# Patient Record
Sex: Female | Born: 1985 | Race: Black or African American | Hispanic: No | Marital: Married | Smoking: Never smoker
Health system: Southern US, Community
[De-identification: ages and names within clinical notes are randomized; demographics above are authoritative.]

## PROBLEM LIST (undated history)

## (undated) DIAGNOSIS — F32A Depression, unspecified: Secondary | ICD-10-CM

## (undated) HISTORY — PX: CYSTECTOMY: SUR359

## (undated) HISTORY — DX: Depression, unspecified: F32.A

---

## 2006-10-28 ENCOUNTER — Ambulatory Visit (HOSPITAL_COMMUNITY): Admission: RE | Admit: 2006-10-28 | Discharge: 2006-10-28 | Payer: Self-pay | Admitting: Obstetrics

## 2006-11-25 ENCOUNTER — Ambulatory Visit (HOSPITAL_COMMUNITY): Admission: RE | Admit: 2006-11-25 | Discharge: 2006-11-25 | Payer: Self-pay | Admitting: Obstetrics

## 2007-02-15 ENCOUNTER — Inpatient Hospital Stay (HOSPITAL_COMMUNITY): Admission: AD | Admit: 2007-02-15 | Discharge: 2007-02-15 | Payer: Self-pay | Admitting: Obstetrics & Gynecology

## 2007-04-04 ENCOUNTER — Inpatient Hospital Stay (HOSPITAL_COMMUNITY): Admission: AD | Admit: 2007-04-04 | Discharge: 2007-04-06 | Payer: Self-pay | Admitting: Obstetrics

## 2007-07-22 IMAGING — US US OB COMP +14 WK
1 series · 14 of 28 positions shown · non-contrast
Comparison: none

OBSTETRICAL ULTRASOUND:

 This ultrasound exam was performed in the [HOSPITAL] Ultrasound Department.  The OB US report was generated in the AS system, and faxed to the ordering physician.  This report is also available in [REDACTED] PACS.

[Series 1: us ob comp +14 wk · 14 of 28 slices shown]
[im 2/28]
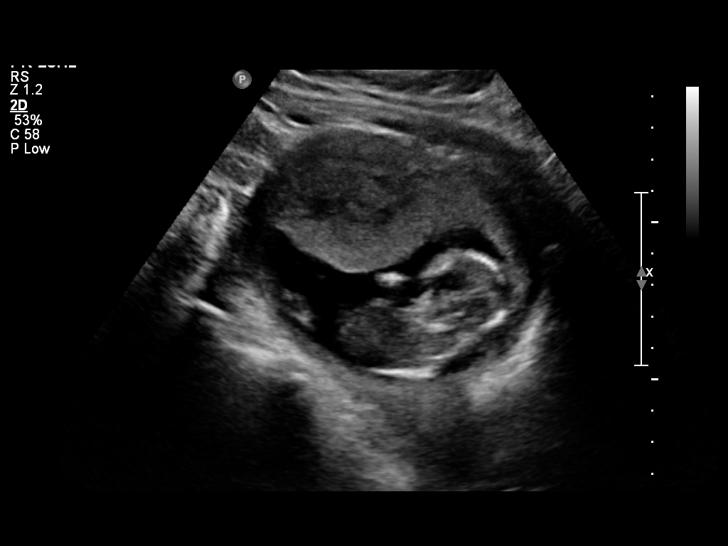
[im 4/28]
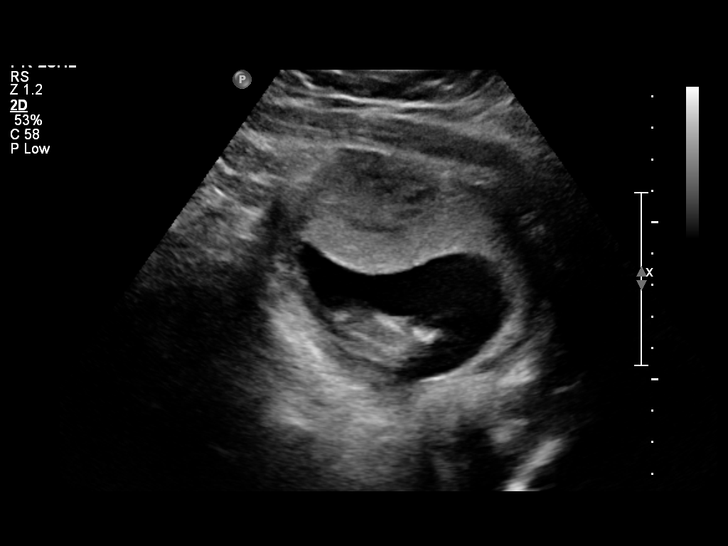
[im 6/28]
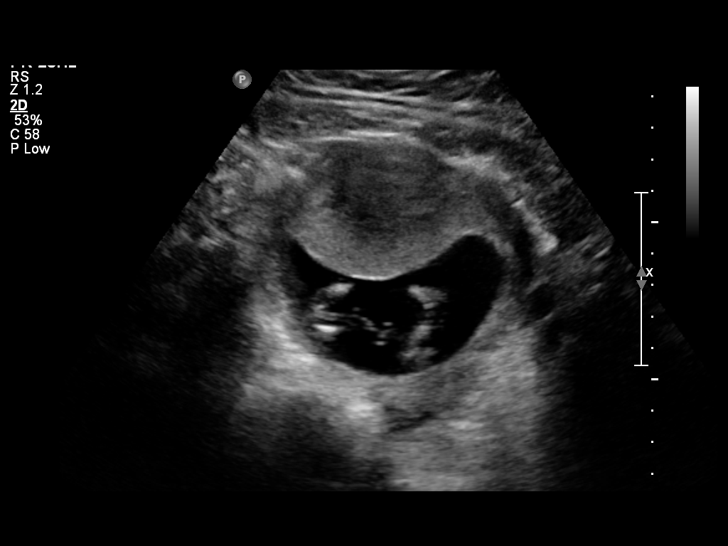
[im 8/28]
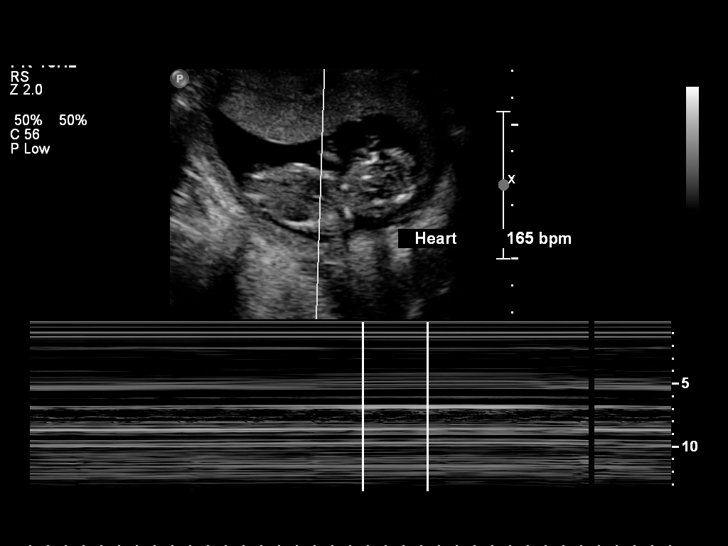
[im 10/28]
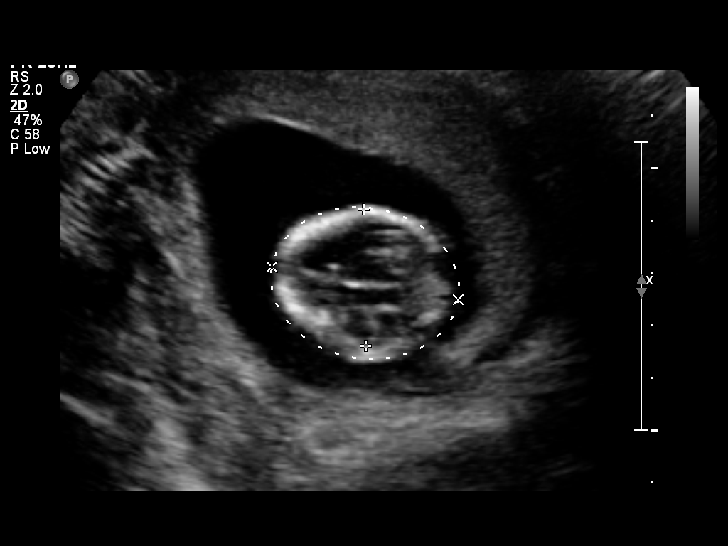
[im 12/28]
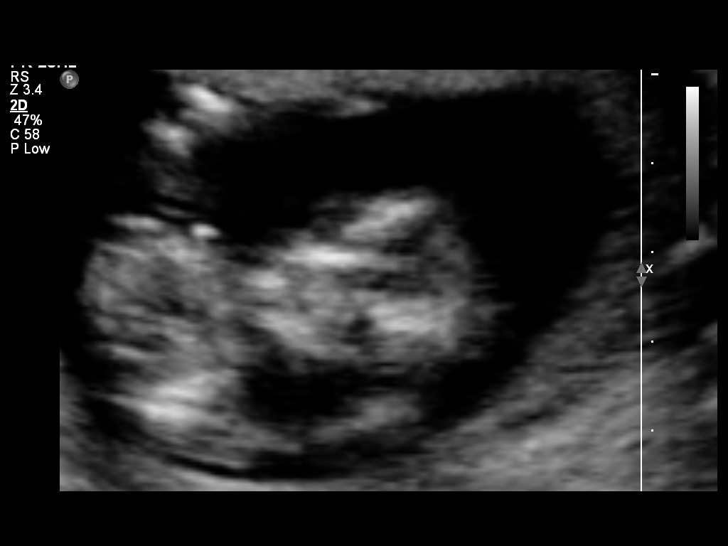
[im 14/28]
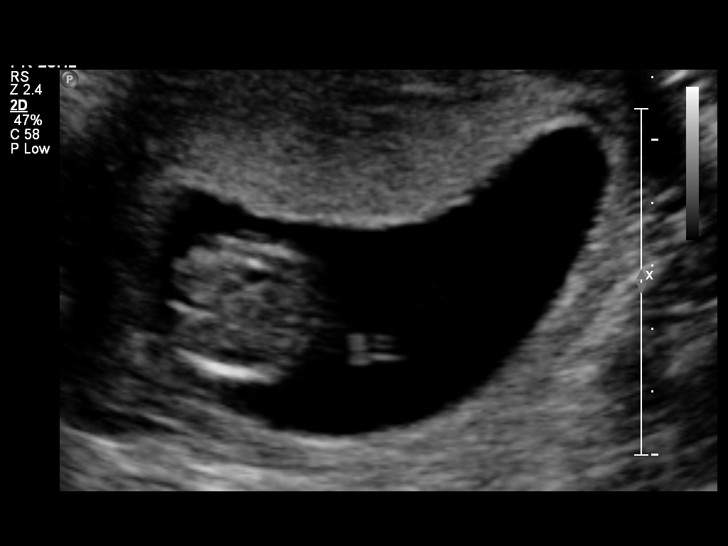
[im 16/28]
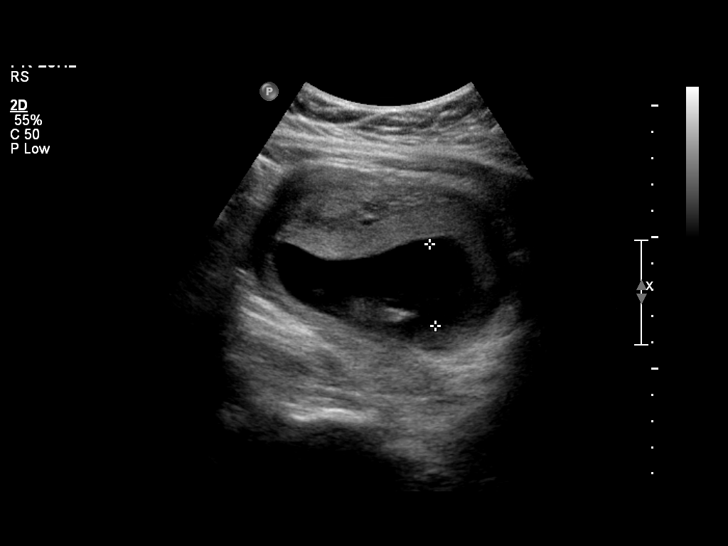
[im 18/28]
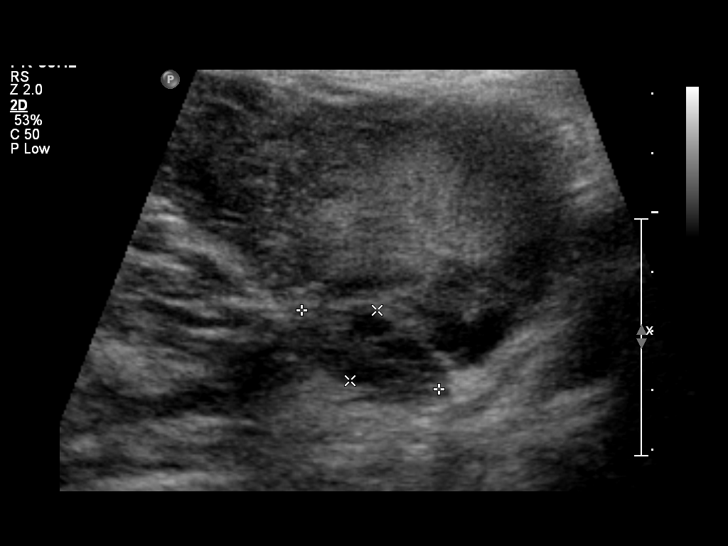
[im 20/28]
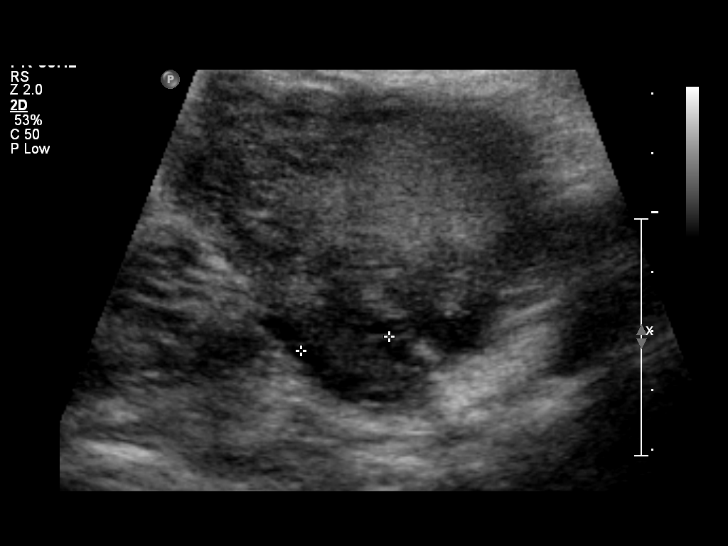
[im 22/28]
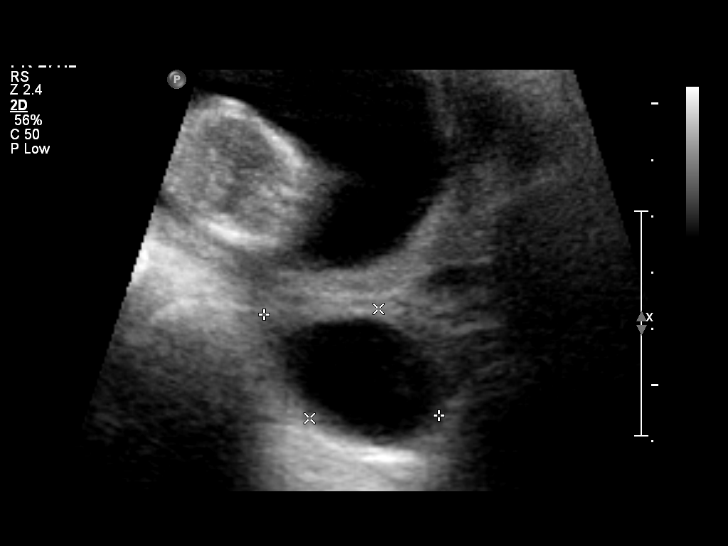
[im 24/28]
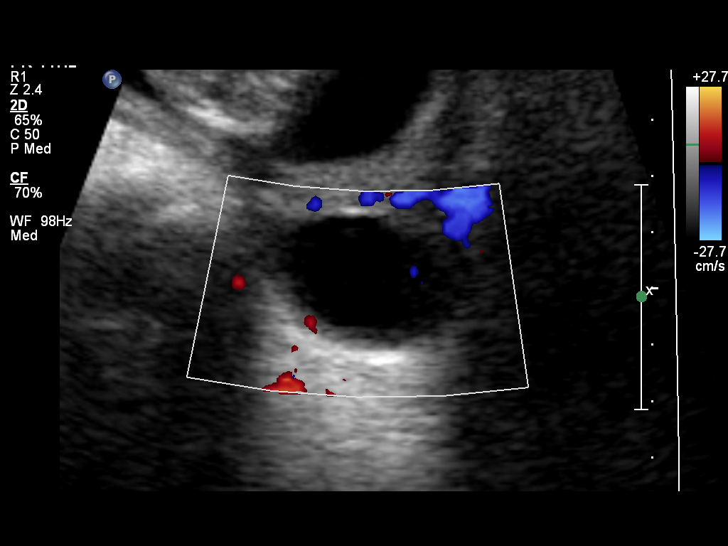
[im 26/28]
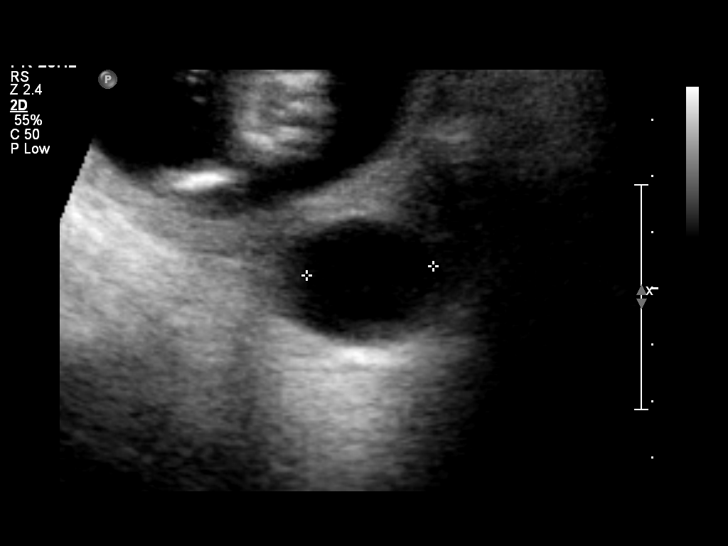
[im 28/28]
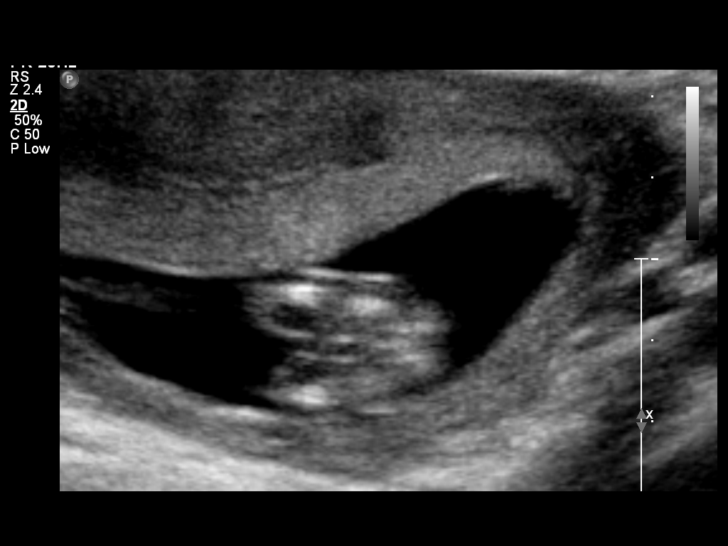

[14 of 28 positions shown; findings below may reference images not displayed]

IMPRESSION: See AS Obstetric US report.

## 2007-08-19 IMAGING — US US OB FOLLOW-UP
1 series · 9 of 9 positions shown · non-contrast
Comparison: none

OBSTETRICAL ULTRASOUND:

 This ultrasound exam was performed in the [HOSPITAL] Ultrasound Department.  The OB US report was generated in the AS system, and faxed to the ordering physician.  This report is also available in [REDACTED] PACS.

[Series 1: us ob follow-up · 0.16mm/px · 9 of 9 slices shown]
[im 1/9]
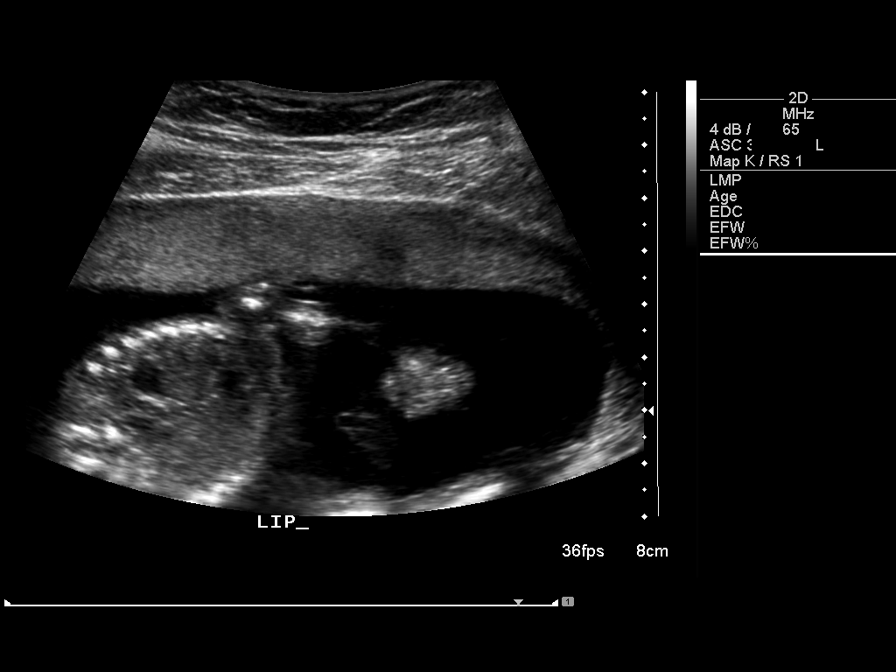
[im 2/9]
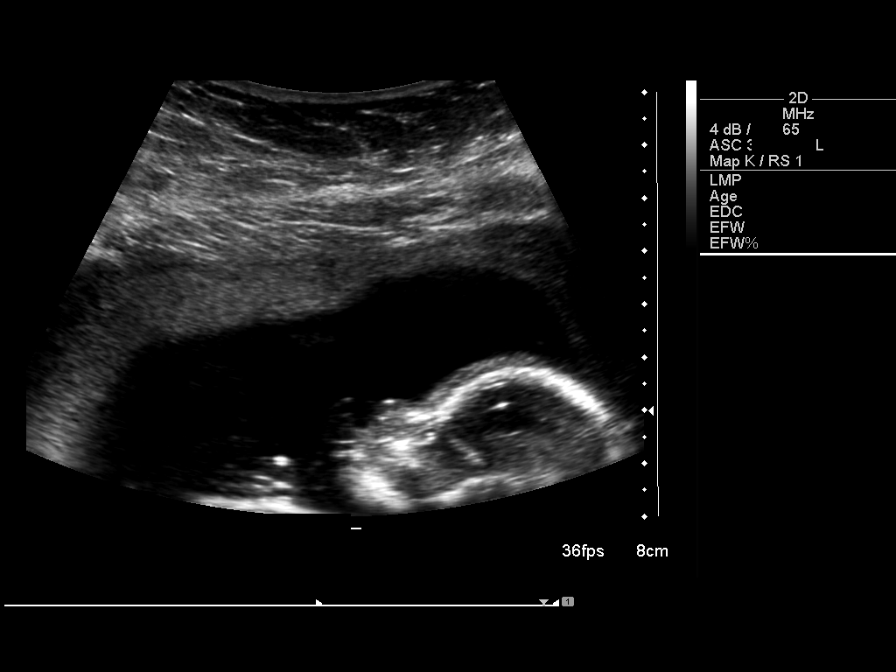
[im 3/9]
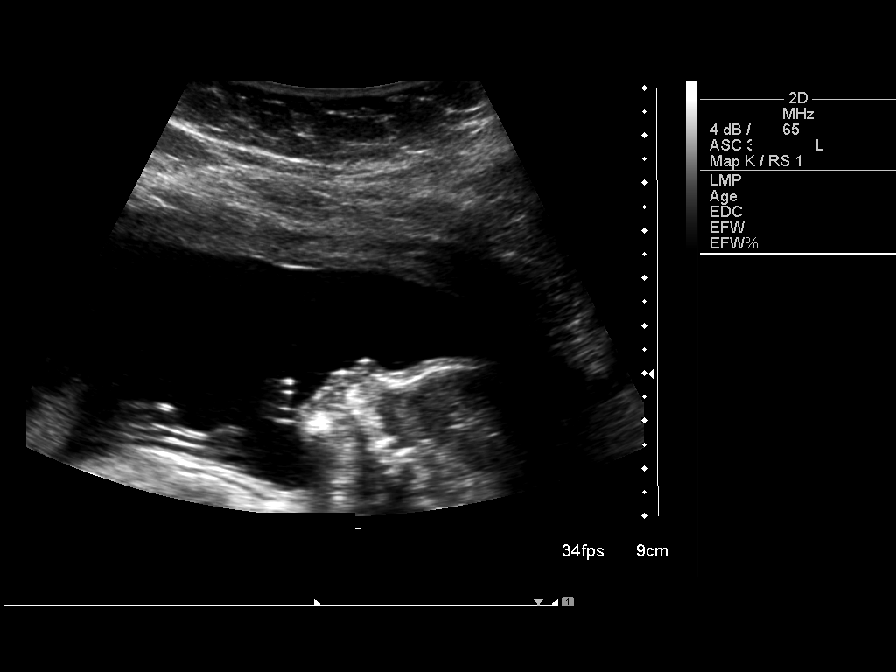
[im 4/9]
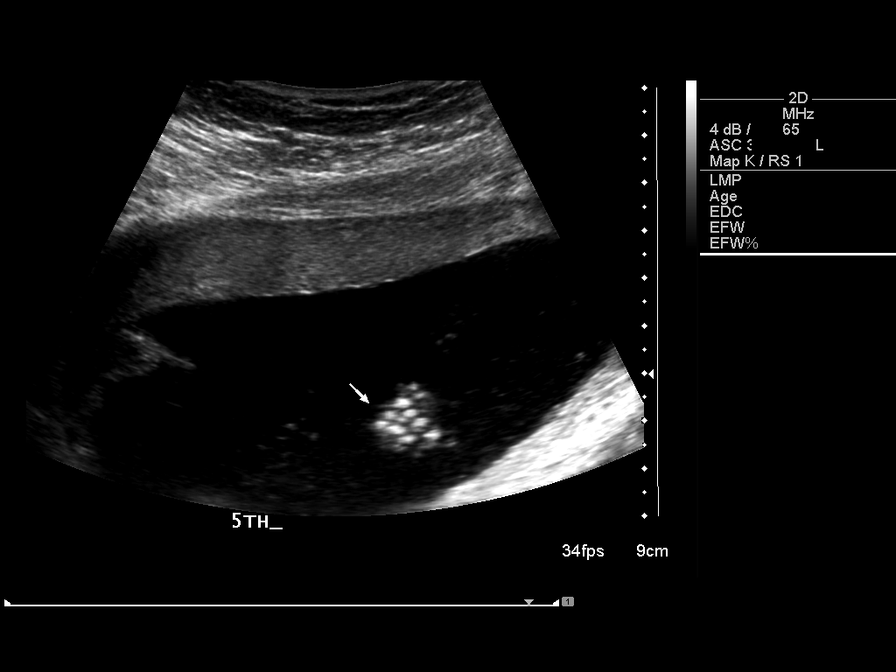
[im 5/9]
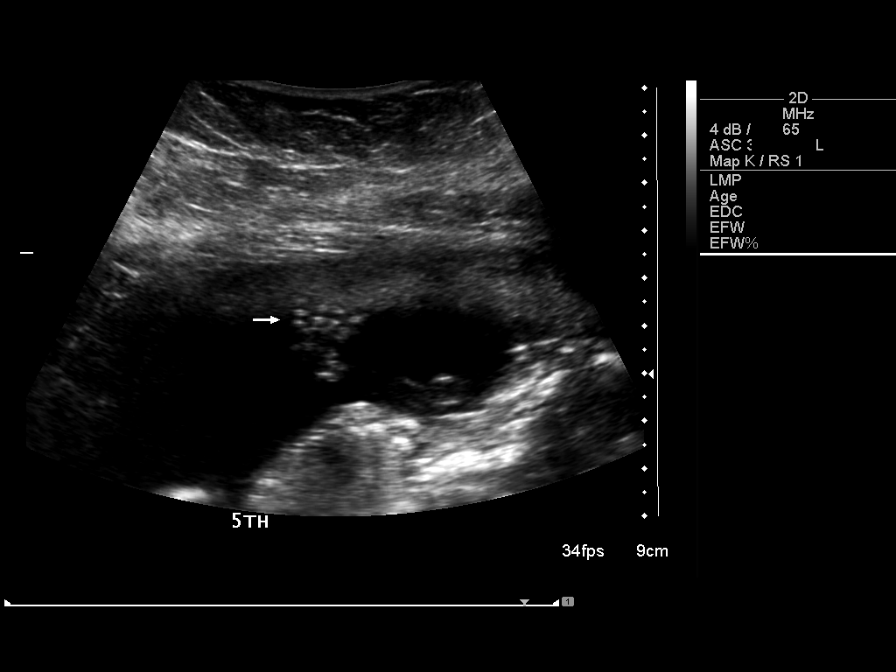
[im 6/9]
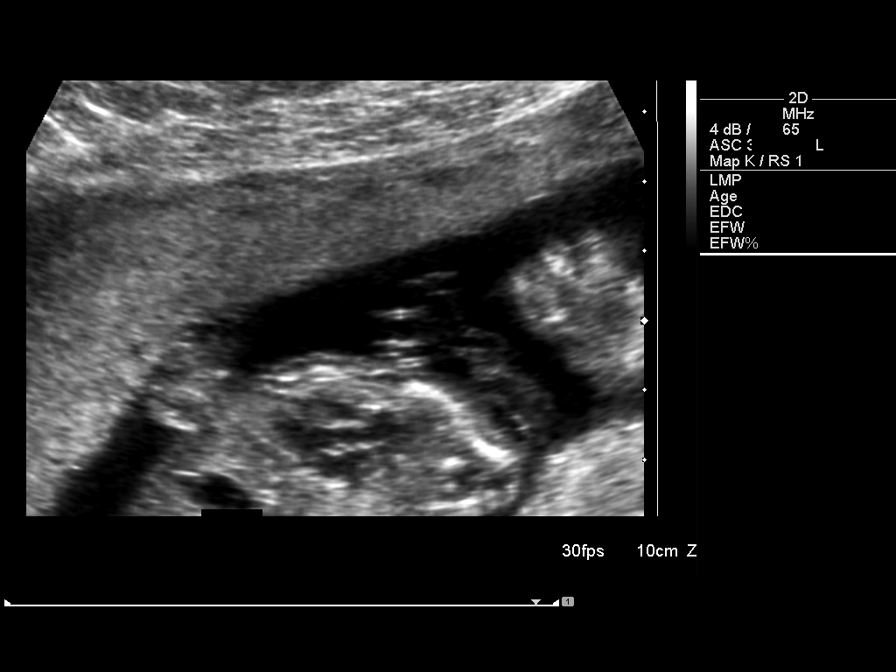
[im 7/9]
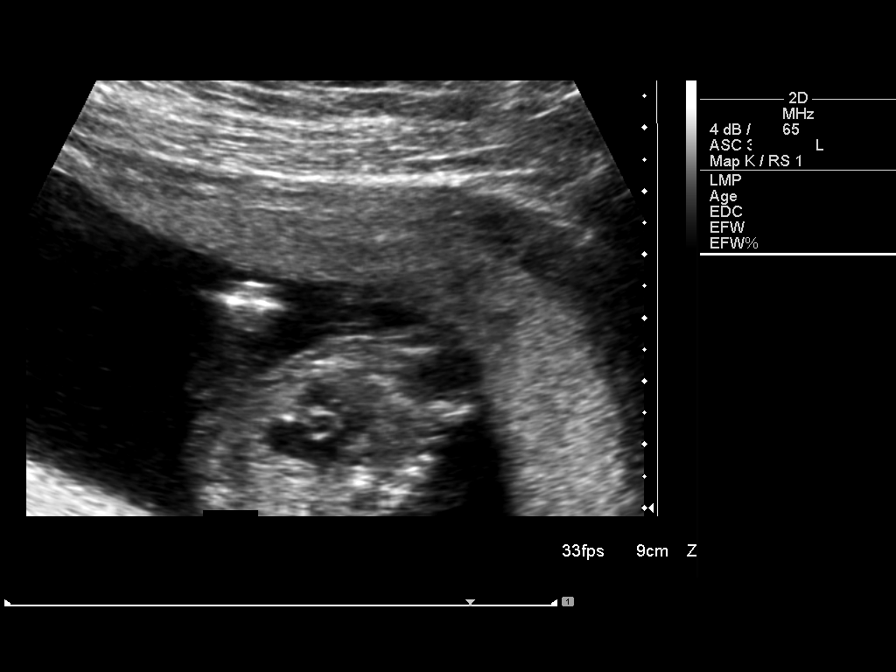
[im 8/9]
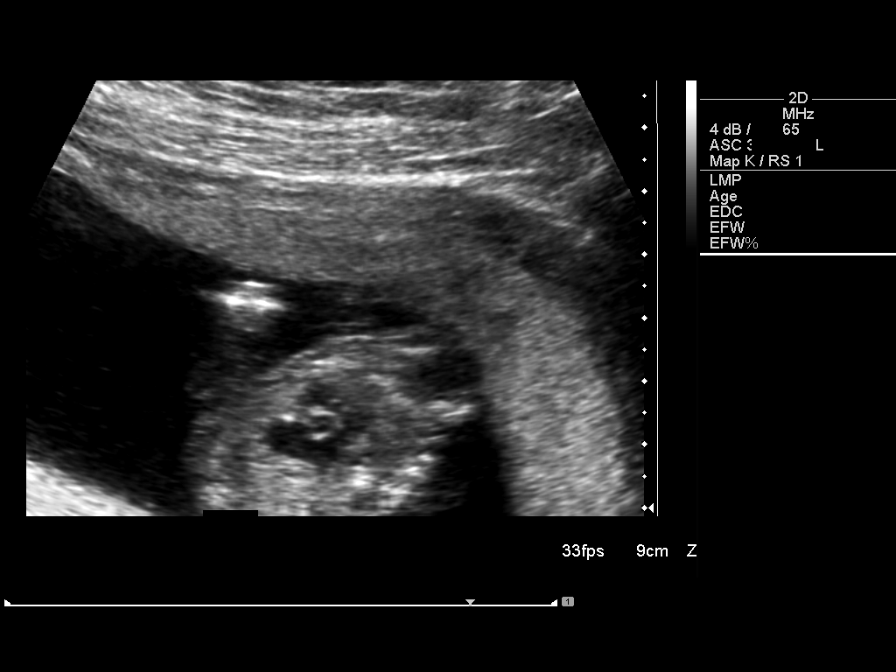
[im 9/9]
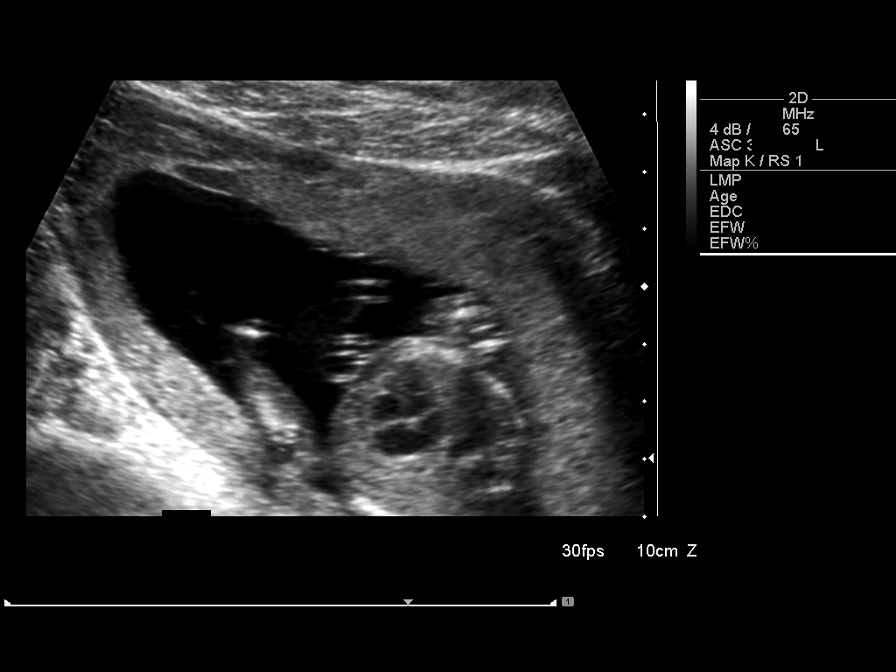

[9 of 9 positions shown; findings below may reference images not displayed]

IMPRESSION: See AS Obstetric US report.

## 2007-09-03 ENCOUNTER — Inpatient Hospital Stay (HOSPITAL_COMMUNITY): Admission: AD | Admit: 2007-09-03 | Discharge: 2007-09-03 | Payer: Self-pay | Admitting: Obstetrics & Gynecology

## 2007-09-03 ENCOUNTER — Encounter: Payer: Self-pay | Admitting: Obstetrics

## 2008-05-27 IMAGING — US US PELVIS COMPLETE MODIFY
1 series · 13 of 25 positions shown · non-contrast
Comparison: Ultrasound abdomen and pelvis [DATE].

CLINICAL DATA: Vaginal delivery 5 months ago. Patient having first period since delivery with very heavy bleeding.
TRANSABDOMINAL AND TRANSVAGINAL PELVIC ULTRASOUND:
TECHNIQUE: Both transabdominal and transvaginal ultrasound examinations of the pelvis were performed including evaluation of the uterus, ovaries, adnexal regions, and pelvic cul-de-sac.

[Series 1: us pelvis complete modify · 0.28mm/px · 13 of 86 slices shown]
[im 1/86]
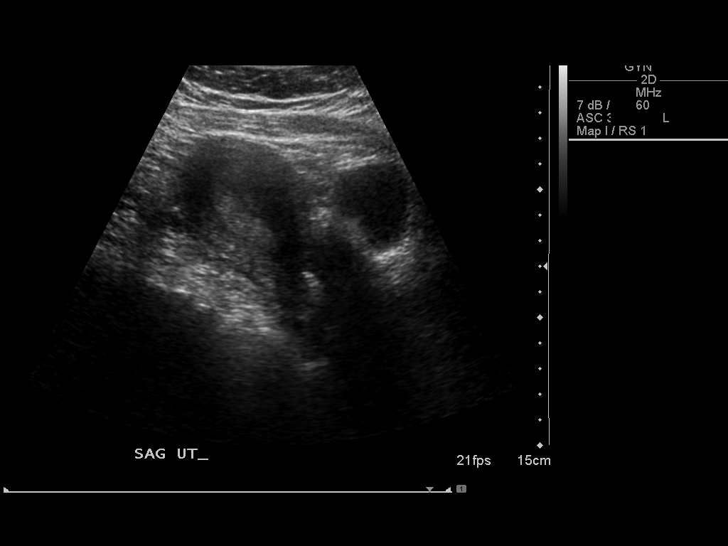
[im 8/86]
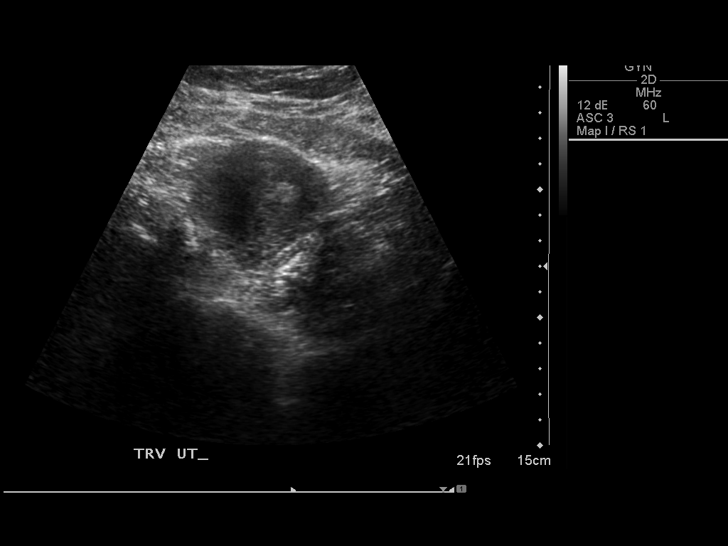
[im 15/86]
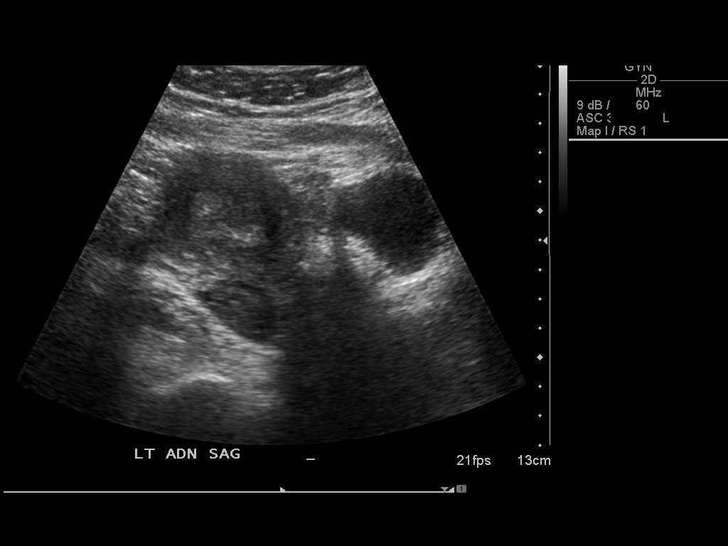
[im 22/86]
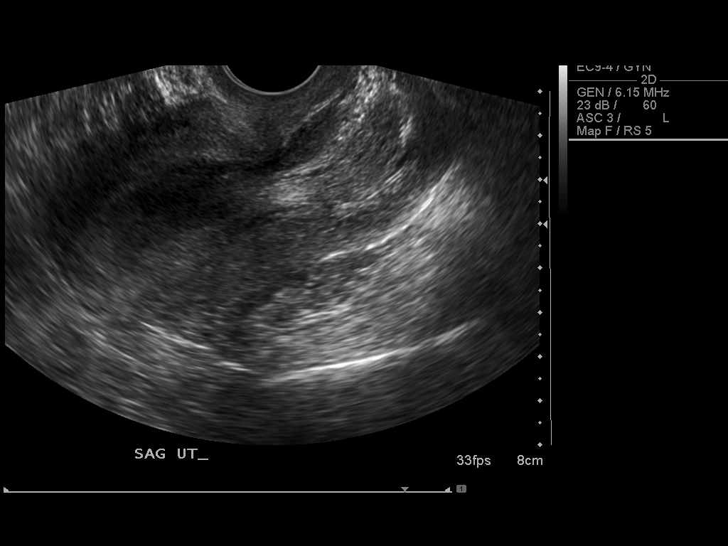
[im 29/86]
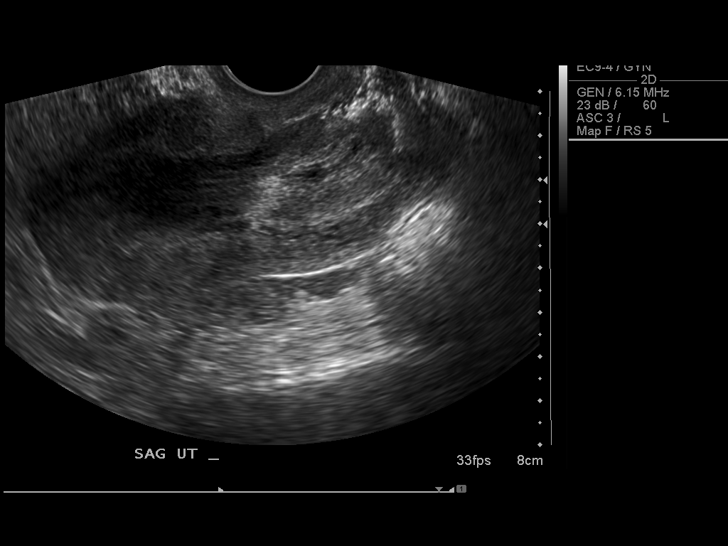
[im 36/86]
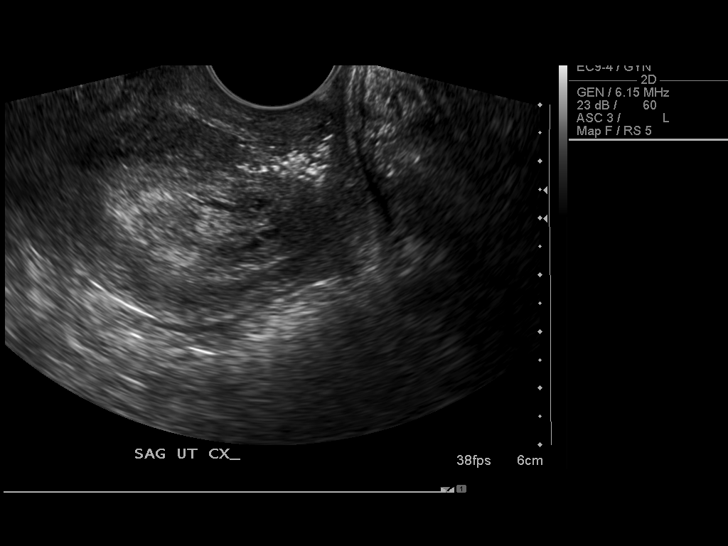
[im 43/86]
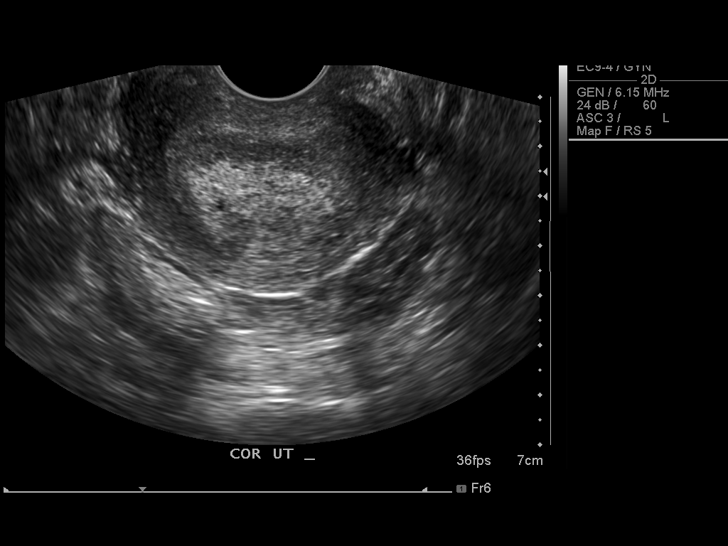
[im 50/86]
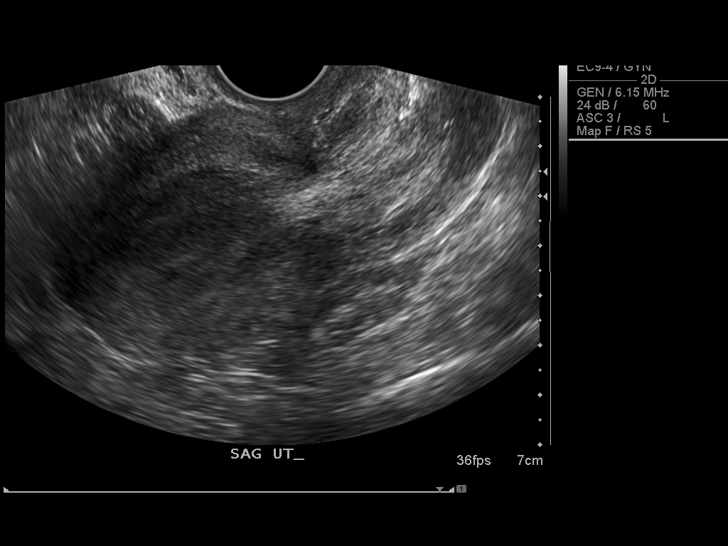
[im 57/86]
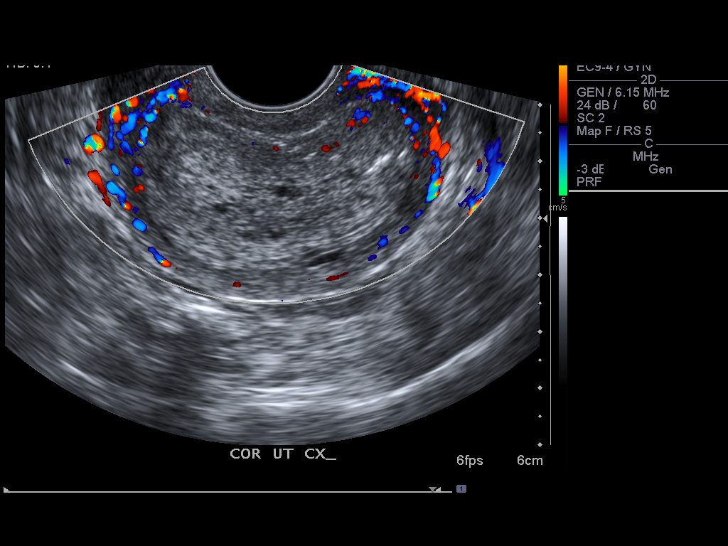
[im 64/86]
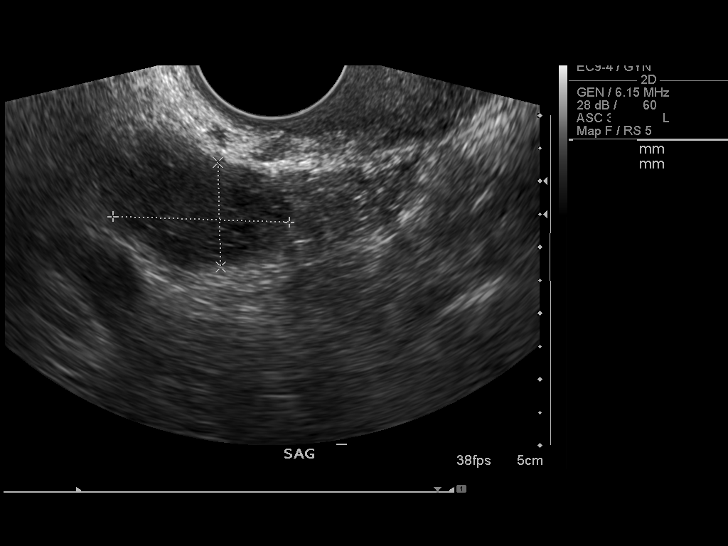
[im 71/86]
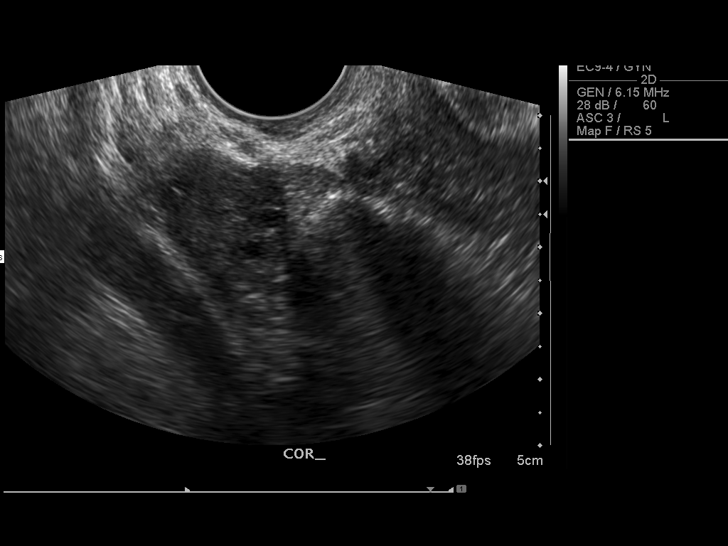
[im 78/86]
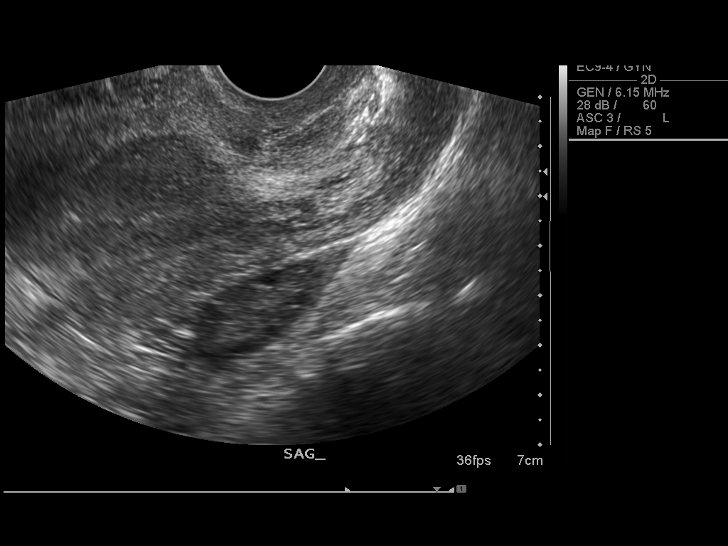
[im 86/86]
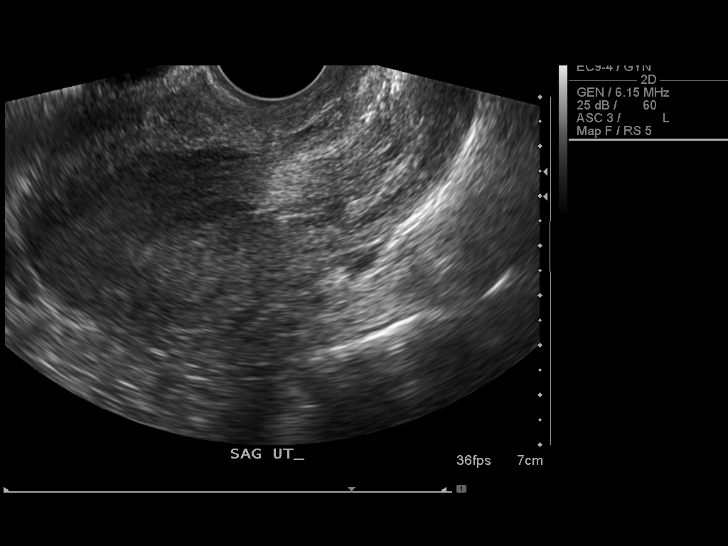

[13 of 25 positions shown; findings below may reference images not displayed]

FINDINGS: The uterus is 9.5 cm in length x 4.6 cm AP diameter x 5.1 cm in width.  
The endometrial canal is thickened and filled with heterogeneous echogenic material with some internal cystic areas, that measures 4.7 cm transverse diameter x 2.4 cm AP diameter x 3.7 cm in sagittal diameter. On some of the images, there is vascular flow seen within this echogenic material.  In the lower uterine segment/region of the cervix, there are multiple very echogenic foci, suggestive of air.  
The ovaries are normal in size and appearance.  There is no free fluid in the pelvis.
IMPRESSION: 1.  Marked thickening of the endometrial canal, filled with echogenic, heterogeneous material . Findings are most concerning for retained products of conception and were discussed with the nurse practitioner taking care of the patient..
2.  Suggestion of air in the lower uterine segment/cervix, likely attributable to recent cervical manipulation in an attempt to remove a blood clot. According to the nurse practitioner, there is no clinical concern for endometritis.
Findings were discussed with Mcewen, the patient?s nurse practitioner.

## 2010-09-14 ENCOUNTER — Encounter: Payer: Self-pay | Admitting: Obstetrics

## 2011-05-14 LAB — GC/CHLAMYDIA PROBE AMP, GENITAL
Chlamydia, DNA Probe: NEGATIVE
GC Probe Amp, Genital: NEGATIVE

## 2011-05-14 LAB — POCT PREGNANCY, URINE: Operator id: 117411

## 2011-05-14 LAB — WET PREP, GENITAL
Clue Cells Wet Prep HPF POC: NONE SEEN
Trich, Wet Prep: NONE SEEN
Yeast Wet Prep HPF POC: NONE SEEN

## 2011-05-14 LAB — CBC
MCV: 83.2
RBC: 4.24
WBC: 8.9

## 2011-06-08 LAB — CBC
HCT: 36
MCHC: 33.3
MCHC: 33.6
MCV: 84.8
Platelets: 245
Platelets: 271
RDW: 14
RDW: 14.3 — ABNORMAL HIGH

## 2011-06-08 LAB — RH IMMUNE GLOB WKUP(>/=20WKS)(NOT WOMEN'S HOSP): Fetal Screen: NEGATIVE

## 2011-06-08 LAB — RPR: RPR Ser Ql: NONREACTIVE

## 2011-06-10 LAB — RH IMMUNE GLOBULIN WORKUP (NOT WOMEN'S HOSP)

## 2016-06-05 ENCOUNTER — Emergency Department
Admission: EM | Admit: 2016-06-05 | Discharge: 2016-06-05 | Disposition: A | Discharge status: Home / Self Care | Payer: Self-pay | Attending: Emergency Medicine | Admitting: Emergency Medicine

## 2016-06-05 ENCOUNTER — Encounter: Payer: Self-pay | Admitting: Emergency Medicine

## 2016-06-05 DIAGNOSIS — H6692 Otitis media, unspecified, left ear: Secondary | ICD-10-CM | POA: Insufficient documentation

## 2016-06-05 MED ORDER — AZITHROMYCIN 250 MG PO TABS: ORAL_TABLET | ORAL | 0 refills | Status: DC

## 2016-06-05 NOTE — ED Provider Notes (Signed)
[facility] Emergency Department Provider Note  ____________________________________________  Time seen: Approximately 10:12 AM  I have reviewed the triage vital signs and the nursing notes.   HISTORY  Chief Complaint Otalgia    HPI Wendy Juarez is a 30 y.o. female who presents for evaluation of bilateral ear pain left greater than right. She feels like something stronger left ear.   History reviewed. No pertinent past medical history.  There are no active problems to display for this patient.   History reviewed. No pertinent surgical history.  Prior to Admission medications   Medication Sig Start Date End Date Taking? Authorizing Provider  azithromycin (ZITHROMAX Z-PAK) 250 MG tablet Take 2 tablets (500 mg) on  Day 1,  followed by 1 tablet (250 mg) once daily on Days 2 through 5. 06/05/16   Evangeline Dakinharles M Rosali Augello, PA-C    Allergies Review of patient's allergies indicates no known allergies.  No family history on file.  Social History Social History  Substance Use Topics  . Smoking status: Never Smoker  . Smokeless tobacco: Never Used  . Alcohol use Not on file    Review of Systems Constitutional: No fever/chills Eyes: No visual changes. ENT: Positive for bilateral ear pain left greater than right. Cardiovascular: Denies chest pain. Respiratory: Denies shortness of breath. Musculoskeletal: Negative for back pain. Skin: Negative for rash. Neurological: Negative for headaches, focal weakness or numbness.  10-point ROS otherwise negative.  ____________________________________________   PHYSICAL EXAM:  VITAL SIGNS: ED Triage Vitals  Enc Vitals Group     BP 06/05/16 0912 (!) 130/55     Pulse Rate 06/05/16 0912 85     Resp 06/05/16 0912 18     Temp 06/05/16 0912 98 F (36.7 C)     Temp Source 06/05/16 0912 Oral     SpO2 06/05/16 0912 99 %     Weight 06/05/16 0912 180 lb (81.6 kg)     Height 06/05/16 0912 5\' 2"  (1.575 m)   Head Circumference --      Peak Flow --      Pain Score 06/05/16 0906 6     Pain Loc --      Pain Edu? --      Excl. in GC? --     Constitutional: Alert and oriented. Well appearing and in no acute distress. Eyes: Conjunctivae are normal. PERRL. EOMI. Head: Atraumatic.Left TM erythematous right TM unremarkable. Nose: No congestion/rhinnorhea. Mouth/Throat: Mucous membranes are moist.  Oropharynx non-erythematous. Neck: No stridor. Supple, full range of motion.   Cardiovascular: Normal rate, regular rhythm. Grossly normal heart sounds.  Good peripheral circulation. Respiratory: Normal respiratory effort.  No retractions. Lungs CTAB. Musculoskeletal: No lower extremity tenderness nor edema.  No joint effusions. Neurologic:  Normal speech and language. No gross focal neurologic deficits are appreciated. No gait instability. Skin:  Skin is warm, dry and intact. No rash noted. Psychiatric: Mood and affect are normal. Speech and behavior are normal.  ____________________________________________   LABS (all labs ordered are listed, but only abnormal results are displayed)  Labs Reviewed - No data to display ____________________________________________  EKG   ____________________________________________  RADIOLOGY   ____________________________________________   PROCEDURES  Procedure(s) performed: None  Critical Care performed: No  ____________________________________________   INITIAL IMPRESSION / ASSESSMENT AND PLAN / ED COURSE  Pertinent labs & imaging results that were available during my care of the patient were reviewed by me and considered in my medical decision making (see chart for details). Review of the  [place] CSRS was performed in accordance of the [facility] prior to dispensing any controlled drugs.  Assessment. Rx Given for Zithromax. Patient Follow-Up PCP or Return to ER As Needed.  Clinical Course    ____________________________________________   FINAL  CLINICAL IMPRESSION(S) / ED DIAGNOSES  Final diagnoses:  Left otitis media, unspecified otitis media type     This chart was dictated using voice recognition software/Dragon. Despite best efforts to proofread, errors can occur which can change the meaning. Any change was purely unintentional.    Evangeline Dakin, PA-C 06/05/16 1043    Evangeline Dakin, PA-C 06/05/16 1135    Phineas Semen, MD 06/05/16 [phone]

## 2016-06-05 NOTE — ED Triage Notes (Signed)
Reports bilat ear pain, states it feels like something is crawling in left ear.

## 2017-12-28 ENCOUNTER — Encounter: Payer: Self-pay | Admitting: Emergency Medicine

## 2017-12-28 ENCOUNTER — Other Ambulatory Visit: Payer: Self-pay

## 2017-12-28 ENCOUNTER — Emergency Department
Admission: EM | Admit: 2017-12-28 | Discharge: 2017-12-28 | Disposition: A | Discharge status: Home / Self Care | Payer: Medicaid Other | Attending: Emergency Medicine | Admitting: Emergency Medicine

## 2017-12-28 ENCOUNTER — Emergency Department: Payer: Medicaid Other

## 2017-12-28 DIAGNOSIS — O9989 Other specified diseases and conditions complicating pregnancy, childbirth and the puerperium: Secondary | ICD-10-CM | POA: Insufficient documentation

## 2017-12-28 DIAGNOSIS — R109 Unspecified abdominal pain: Secondary | ICD-10-CM

## 2017-12-28 DIAGNOSIS — N644 Mastodynia: Secondary | ICD-10-CM | POA: Insufficient documentation

## 2017-12-28 DIAGNOSIS — Z3A11 11 weeks gestation of pregnancy: Secondary | ICD-10-CM | POA: Diagnosis not present

## 2017-12-28 DIAGNOSIS — Z3491 Encounter for supervision of normal pregnancy, unspecified, first trimester: Secondary | ICD-10-CM

## 2017-12-28 DIAGNOSIS — R1030 Lower abdominal pain, unspecified: Secondary | ICD-10-CM | POA: Insufficient documentation

## 2017-12-28 DIAGNOSIS — O26899 Other specified pregnancy related conditions, unspecified trimester: Secondary | ICD-10-CM

## 2017-12-28 DIAGNOSIS — O2311 Infections of bladder in pregnancy, first trimester: Secondary | ICD-10-CM | POA: Insufficient documentation

## 2017-12-28 DIAGNOSIS — N3090 Cystitis, unspecified without hematuria: Secondary | ICD-10-CM

## 2017-12-28 LAB — COMPREHENSIVE METABOLIC PANEL
ALT: 12 U/L — ABNORMAL LOW (ref 14–54)
ANION GAP: 10 (ref 5–15)
AST: 31 U/L (ref 15–41)
Albumin: 3.5 g/dL (ref 3.5–5.0)
Alkaline Phosphatase: 85 U/L (ref 38–126)
BILIRUBIN TOTAL: 1.3 mg/dL — AB (ref 0.3–1.2)
BUN: 6 mg/dL (ref 6–20)
CO2: 18 mmol/L — ABNORMAL LOW (ref 22–32)
Calcium: 9 mg/dL (ref 8.9–10.3)
Chloride: 106 mmol/L (ref 101–111)
Creatinine, Ser: 0.43 mg/dL — ABNORMAL LOW (ref 0.44–1.00)
Glucose, Bld: 108 mg/dL — ABNORMAL HIGH (ref 65–99)
POTASSIUM: 4.2 mmol/L (ref 3.5–5.1)
Sodium: 134 mmol/L — ABNORMAL LOW (ref 135–145)
Total Protein: 7.7 g/dL (ref 6.5–8.1)

## 2017-12-28 LAB — TROPONIN I

## 2017-12-28 LAB — HCG, QUANTITATIVE, PREGNANCY: hCG, Beta Chain, Quant, S: 64249 m[IU]/mL — ABNORMAL HIGH (ref <5)

## 2017-12-28 LAB — URINALYSIS, COMPLETE (UACMP) WITH MICROSCOPIC
BILIRUBIN URINE: NEGATIVE
Glucose, UA: NEGATIVE mg/dL
Ketones, ur: NEGATIVE mg/dL
NITRITE: NEGATIVE
PH: 6 (ref 5.0–8.0)
Protein, ur: NEGATIVE mg/dL
SPECIFIC GRAVITY, URINE: 1.01 (ref 1.005–1.030)

## 2017-12-28 LAB — CBC
HEMATOCRIT: 36.1 % (ref 35.0–47.0)
Hemoglobin: 12.1 g/dL (ref 12.0–16.0)
MCH: 27.5 pg (ref 26.0–34.0)
MCHC: 33.5 g/dL (ref 32.0–36.0)
MCV: 82.2 fL (ref 80.0–100.0)
Platelets: 366 10*3/uL (ref 150–440)
RBC: 4.39 MIL/uL (ref 3.80–5.20)
RDW: 14.9 % — AB (ref 11.5–14.5)
WBC: 9.6 10*3/uL (ref 3.6–11.0)

## 2017-12-28 LAB — POCT PREGNANCY, URINE: PREG TEST UR: POSITIVE — AB

## 2017-12-28 LAB — LIPASE, BLOOD: LIPASE: 28 U/L (ref 11–51)

## 2017-12-28 MED ORDER — CEPHALEXIN 500 MG PO CAPS: 500.0000 mg | ORAL_CAPSULE | Freq: Two times a day (BID) | ORAL | 0 refills | Status: AC

## 2017-12-28 NOTE — ED Provider Notes (Signed)
[facility] Emergency Department Provider Note  ____________________________________________  Time seen: Approximately 12:48 PM  I have reviewed the triage vital signs and the nursing notes.   HISTORY  Chief Complaint Breast Pain and Abdominal Pain    HPI Wendy Juarez is a 32 y.o. female that is 11-[redacted] weeks pregnant presents to the emergency department for evaluation of 2 episodes of right sharp breast pain and an episode of low abdominal pain this morning.  Abdominal pain this morning was sharp and now she feels some mild discomfort. No cramping or feeling of contraction. Breast pain encompassed entire right breast but was primarily above nipple.  Episode lasted about 30 seconds.  She had an ultrasound completed at 8 weeks. Patient is currently breast-feeding and has 5 children.  She has had nausea but none that is out of the ordinary.  No vaginal bleeding or vaginal discharge.  No fever, chills, shortness of breath, dysuria.    History reviewed. No pertinent past medical history.  There are no active problems to display for this patient.   History reviewed. No pertinent surgical history.  Prior to Admission medications   Medication Sig Start Date End Date Taking? Authorizing Provider  azithromycin (ZITHROMAX Z-PAK) 250 MG tablet Take 2 tablets (500 mg) on  Day 1,  followed by 1 tablet (250 mg) once daily on Days 2 through 5. 06/05/16   Beers, Charmayne Sheer, PA-C  cephALEXin (KEFLEX) 500 MG capsule Take 1 capsule (500 mg total) by mouth 2 (two) times daily for 10 days. 12/28/17 01/07/18  Enid Derry, PA-C    Allergies Patient has no known allergies.  No family history on file.  Social History Social History   Tobacco Use  . Smoking status: Never Smoker  . Smokeless tobacco: Never Used  Substance Use Topics  . Alcohol use: Not on file  . Drug use: Not on file     Review of Systems  Constitutional: No fever/chills Respiratory: No  SOB. Gastrointestinal: No vomiting.  Genitourinary: Negative for dysuria. Musculoskeletal: Negative for musculoskeletal pain. Skin: Negative for rash, abrasions, lacerations, ecchymosis.   ____________________________________________   PHYSICAL EXAM:  VITAL SIGNS: ED Triage Vitals  Enc Vitals Group     BP 12/28/17 1212 126/67     Pulse Rate 12/28/17 1212 96     Resp 12/28/17 1212 20     Temp 12/28/17 1212 98.3 F (36.8 C)     Temp Source 12/28/17 1212 Oral     SpO2 12/28/17 1212 99 %     Weight 12/28/17 1214 180 lb (81.6 kg)     Height 12/28/17 1214  (1.575 m)     Head Circumference --      Peak Flow --      Pain Score 12/28/17 1231 4     Pain Loc --      Pain Edu? --      Excl. in GC? --      Constitutional: Alert and oriented. Well appearing and in no acute distress. Eyes: Conjunctivae are normal. PERRL. EOMI. Head: Atraumatic. ENT:      Ears:      Nose: No congestion/rhinnorhea.      Mouth/Throat: Mucous membranes are moist.  Neck: No stridor.   Cardiovascular: Normal rate, regular rhythm.  Good peripheral circulation. Respiratory: Normal respiratory effort without tachypnea or retractions. Lungs CTAB. Good air entry to the bases with no decreased or absent breath sounds. Breast: Mild tenderness to palpation above nipple. No drainage or erythema.  No masses. Gastrointestinal: Bowel sounds 4 quadrants. Soft and nontender to palpation. No guarding or rigidity. No palpable masses. No distention. No CVA tenderness. Musculoskeletal: Full range of motion to all extremities. No gross deformities appreciated. Neurologic:  Normal speech and language. No gross focal neurologic deficits are appreciated.  Skin:  Skin is warm, dry and intact. No rash noted. Psychiatric: Mood and affect are normal. Speech and behavior are normal. Patient exhibits appropriate insight and judgement.   ____________________________________________   LABS (all labs ordered are listed, but  only abnormal results are displayed)  Labs Reviewed  CBC - Abnormal; Notable for the following components:      Result Value   RDW 14.9 (*)    All other components within normal limits  COMPREHENSIVE METABOLIC PANEL - Abnormal; Notable for the following components:   Sodium 134 (*)    CO2 18 (*)    Glucose, Bld 108 (*)    Creatinine, Ser 0.43 (*)    ALT 12 (*)    Total Bilirubin 1.3 (*)    All other components within normal limits  URINALYSIS, COMPLETE (UACMP) WITH MICROSCOPIC - Abnormal; Notable for the following components:   Color, Urine YELLOW (*)    APPearance CLEAR (*)    Hgb urine dipstick SMALL (*)    Leukocytes, UA TRACE (*)    Bacteria, UA RARE (*)    All other components within normal limits  HCG, QUANTITATIVE, PREGNANCY - Abnormal; Notable for the following components:   hCG, Beta Chain, Quant, S 64,249 (*)    All other components within normal limits  POCT PREGNANCY, URINE - Abnormal; Notable for the following components:   Preg Test, Ur POSITIVE (*)    All other components within normal limits  URINE CULTURE  TROPONIN I  LIPASE, BLOOD  POC URINE PREG, ED   ____________________________________________  EKG  NSR ____________________________________________  RADIOLOGY   US Ob Comp Less 14 Wks  Result Date: 12/28/2017 CLINICAL DATA:  32 year old female with acute abdominal pain today. Estimated gestational age by LMP 11 weeks and 0 days. Quantitative beta HCG pending. EXAM: OBSTETRIC <14 WK ULTRASOUND TECHNIQUE: Transabdominal ultrasound was performed for evaluation of the gestation as well as the maternal uterus and adnexal regions. COMPARISON:  None relevant. FINDINGS: Intrauterine gestational sac: Single Yolk sac:  Not visible Embryo:  Visible Cardiac Activity: Detected Heart Rate: 165 bpm CRL:   33.2 mm   10 w 1 d                  [place] EDC: 07/25/2018 Subchorionic hemorrhage:  None visualized. Maternal uterus/adnexae: The right ovary measures 2.9 x 4.3 x 2.5  centimeters and contains an oval 2.9 centimeter hypoechoic cyst which might be a corpus luteum (image 66). The left ovary is estimated at 3.3 x 1.4 by 2.8 centimeters and appears normal (image 83). IMPRESSION: Single living IUP demonstrated. No acute maternal findings visualized. Electronically Signed   By: Odessa Fleming M.D.   On: 12/28/2017 14:33    ____________________________________________    PROCEDURES  Procedure(s) performed:    Procedures    Medications - No data to display   ____________________________________________   INITIAL IMPRESSION / ASSESSMENT AND PLAN / ED COURSE  Pertinent labs & imaging results that were available during my care of the patient were reviewed by me and considered in my medical decision making (see chart for details).  Review of the [place] CSRS was performed in accordance of the [facility] prior to dispensing any controlled drugs.  Patient's diagnosis is consistent with cystitis.  Vital signs and exam are reassuring.  Ultrasound shows single intrauterine pregnancy at 10 weeks.  Patient has not had any episodes of breast pain or abdominal pain while in ED.  Urinalysis consistent with infection.  It will be sent for culture.  CBC, CMP, troponin, lipase are reassuring.  EKG shows NSR. Patient will be discharged home with prescriptions for Keflex. Patient is to follow up with obstetrics as directed. Patient is given ED precautions to return to the ED for any worsening or new symptoms.     ____________________________________________  FINAL CLINICAL IMPRESSION(S) / ED DIAGNOSES  Final diagnoses:  First trimester pregnancy  Cystitis      NEW MEDICATIONS STARTED DURING THIS VISIT:  ED Discharge Orders        Ordered    cephALEXin (KEFLEX) 500 MG capsule  2 times daily     12/28/17 1533          This chart was dictated using voice recognition software/Dragon. Despite best efforts to proofread, errors can occur which can change the meaning. Any  change was purely unintentional.    Enid Derry, PA-C 12/28/17 1642    Sharman Cheek, MD 12/29/17 2027

## 2017-12-28 NOTE — ED Provider Notes (Signed)
ED ECG REPORT I, Dionne Bucy, the attending physician, personally viewed and interpreted this ECG.  Date: 12/28/2017 EKG Time: 1335 Rate: 77 Rhythm: normal sinus rhythm QRS Axis: normal Intervals: normal ST/T Wave abnormalities: Anterior lateral T wave flattening, nonspecific Narrative Interpretation: Nonspecific T wave abnormalities, with no evidence of acute ischemia.  No prior EKG available for comparison.    Dionne Bucy, MD 12/28/17 1341

## 2017-12-28 NOTE — ED Triage Notes (Signed)
Presents with 2 episodes of sharp right breast pain which lasted only a few secs    Then developed generalized stomach discomfort   With some nausea  Pt is 11-12 weeks preg    Denies any vaginal bleeding or urinary sx's

## 2017-12-30 LAB — URINE CULTURE

## 2019-02-01 IMAGING — US US OB COMP LESS 14 WK
1 series · 14 of 28 positions shown · non-contrast
Comparison: None relevant.

CLINICAL DATA: 32-year-old female with acute abdominal pain today.
Estimated gestational age by LMP 11 weeks and 0 days. Quantitative
beta HCG pending.

EXAM:
OBSTETRIC <14 WK ULTRASOUND
TECHNIQUE: Transabdominal ultrasound was performed for evaluation of the
gestation as well as the maternal uterus and adnexal regions.

[Series 1: us ob comp less 14 wk · 0.25mm/px · 14 of 82 slices shown]
[im 4/82]
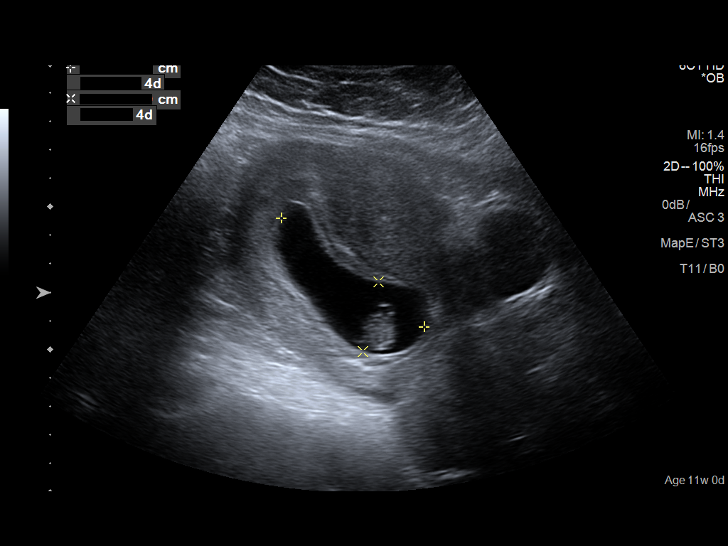
[im 10/82]
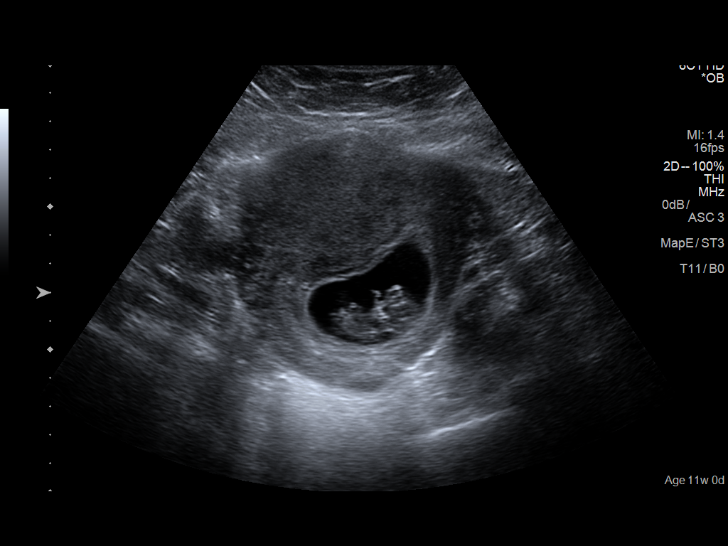
[im 16/82]
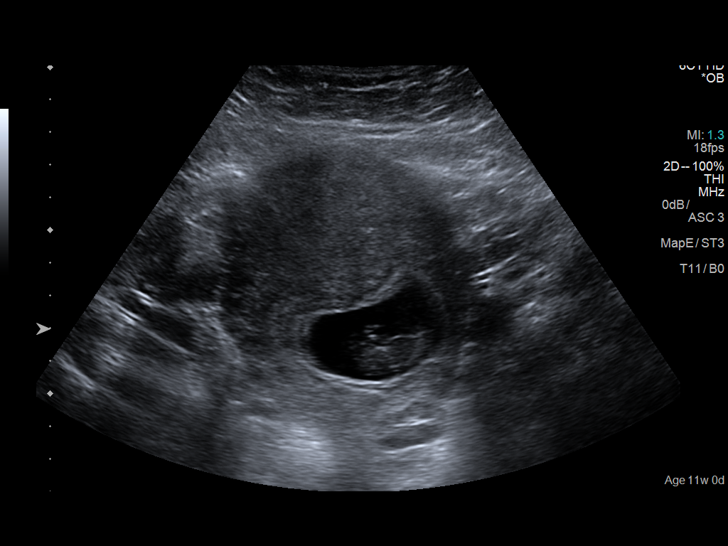
[im 22/82]
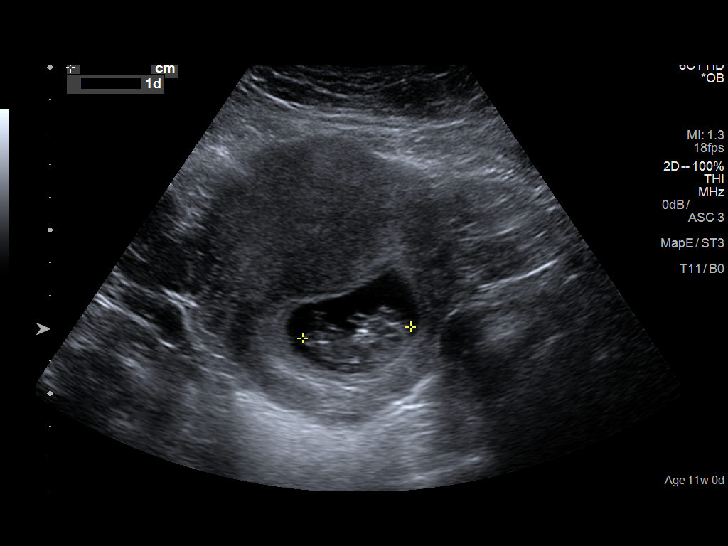
[im 28/82]
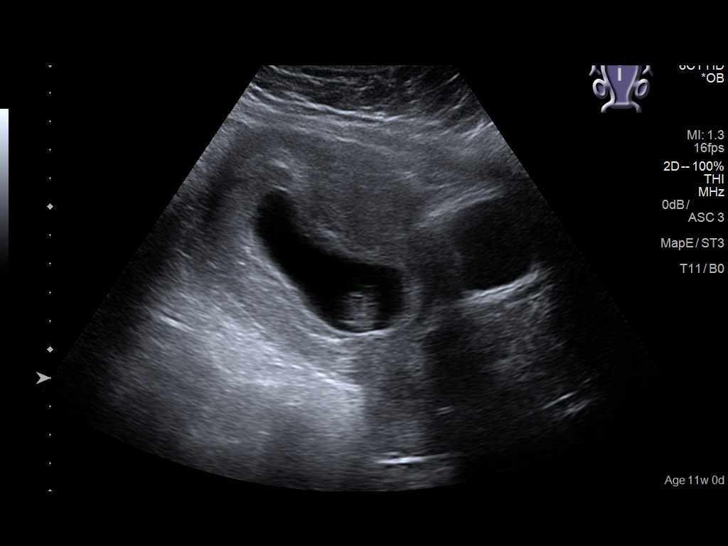
[im 34/82]
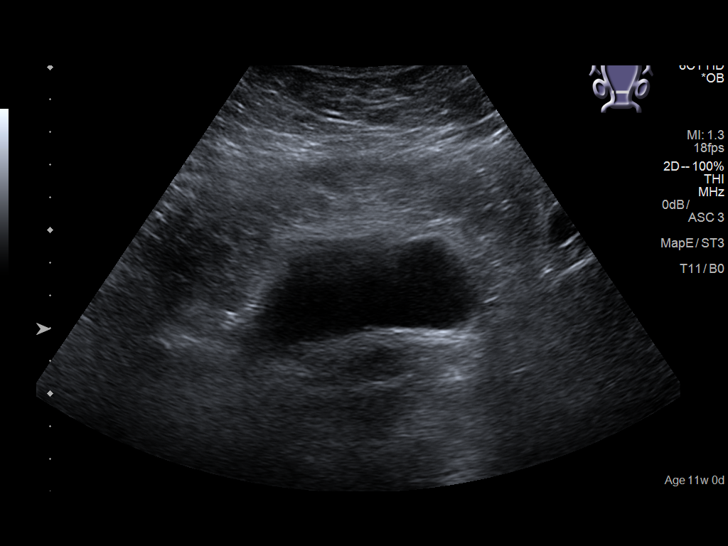
[im 40/82]
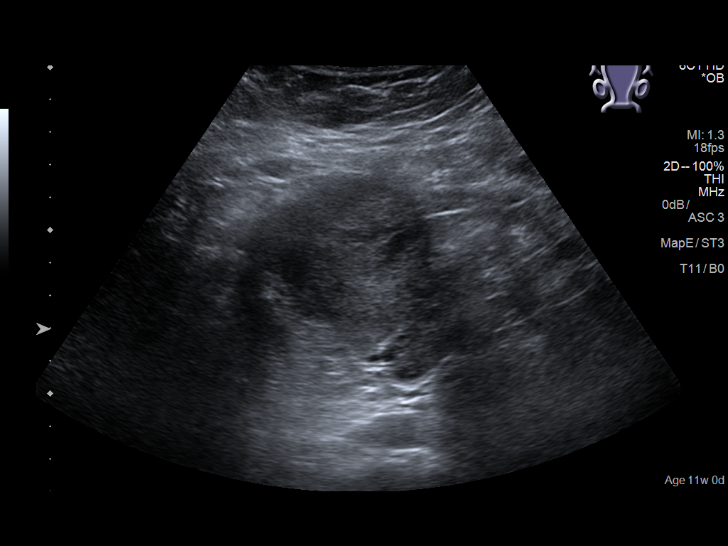
[im 46/82]
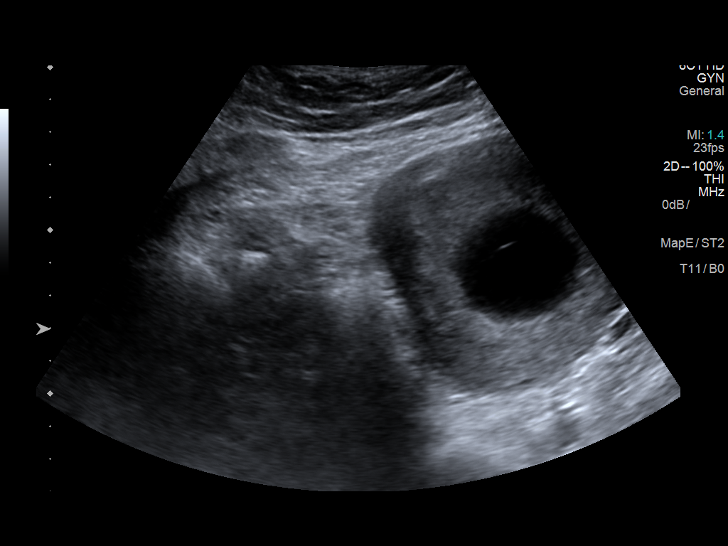
[im 52/82]
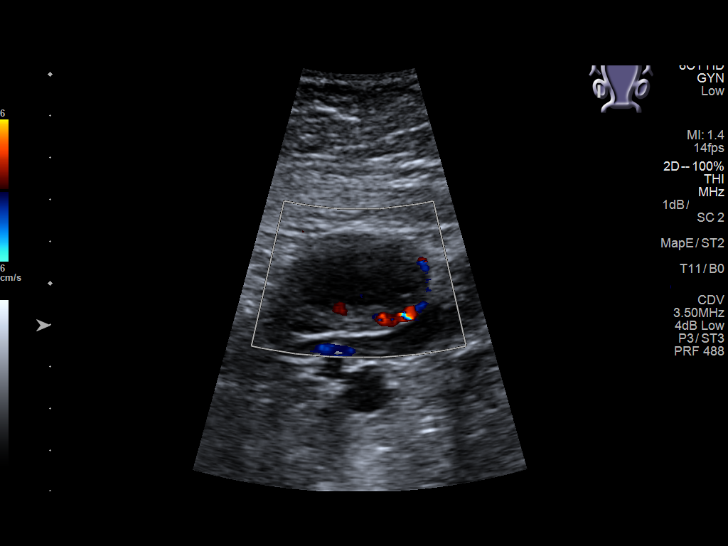
[im 58/82]
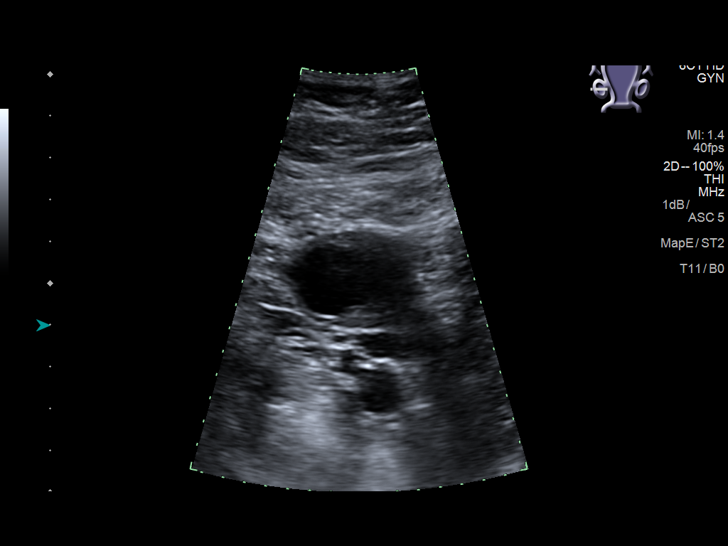
[im 64/82]
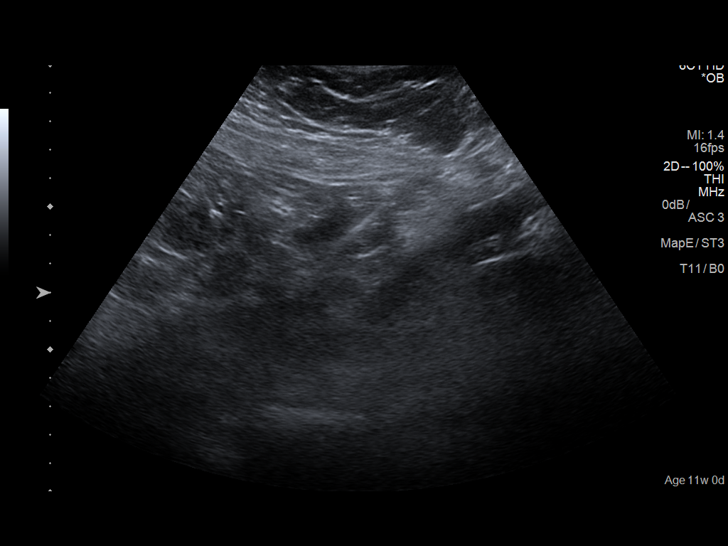
[im 70/82]
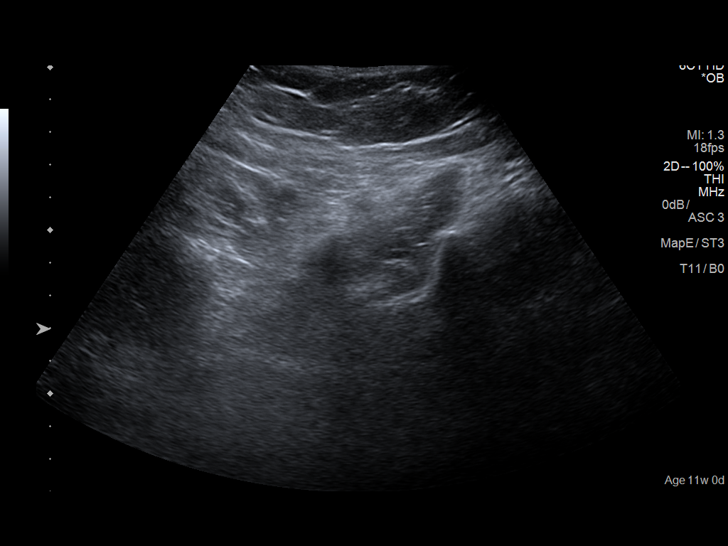
[im 76/82]
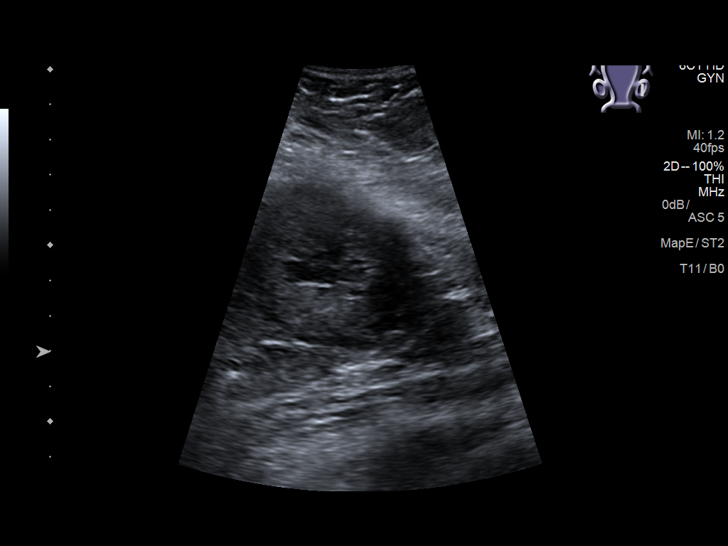
[im 82/82]
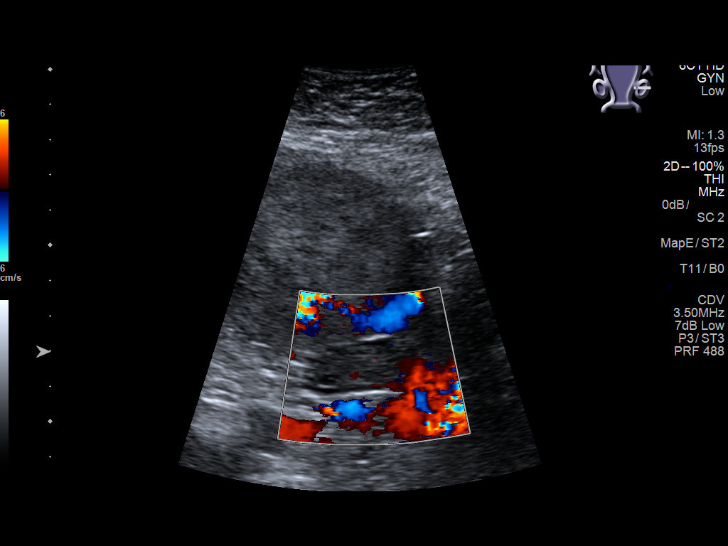

[14 of 28 positions shown; findings below may reference images not displayed]

FINDINGS: Intrauterine gestational sac: Single

Yolk sac:  Not visible

Embryo:  Visible

Cardiac Activity: Detected

Heart Rate: 165 bpm

CRL:   33.2 mm   10 w 1 d                  US EDC: 07/25/2018

Subchorionic hemorrhage:  None visualized.

Maternal uterus/adnexae:

The right ovary measures 2.9 x 4.3 x 2.5 centimeters and contains an
oval 2.9 centimeter hypoechoic cyst which might be a corpus luteum
(image 66).

The left ovary is estimated at 3.3 x 1.4 by 2.8 centimeters and
appears normal (image 83).
IMPRESSION: Single living IUP demonstrated. No acute maternal findings
visualized.

## 2019-07-11 DIAGNOSIS — E669 Obesity, unspecified: Secondary | ICD-10-CM | POA: Insufficient documentation

## 2019-07-13 DIAGNOSIS — B009 Herpesviral infection, unspecified: Secondary | ICD-10-CM | POA: Insufficient documentation

## 2019-07-13 DIAGNOSIS — Z8619 Personal history of other infectious and parasitic diseases: Secondary | ICD-10-CM | POA: Insufficient documentation

## 2020-03-17 DIAGNOSIS — O364XX0 Maternal care for intrauterine death, not applicable or unspecified: Secondary | ICD-10-CM | POA: Insufficient documentation

## 2021-01-02 DIAGNOSIS — Z6791 Unspecified blood type, Rh negative: Secondary | ICD-10-CM | POA: Insufficient documentation

## 2021-08-24 NOTE — L&D Delivery Note (Signed)
[facility] Delivery Note  Wendy Juarez is a 36 y.o. [id number] s/p SOL at [redacted]w[redacted]d. She was admitted for active labor.   ROM: 2h 71m with clear fluid GBS Status: unk (declined) Maximum Maternal Temperature: 98.1  Labor Progress: Ms Fiallo was admitted at 7cm. She had an epidural placed and then progressed slowly to 9-10cm at which time she had SROM with exam.  Delivery Date/Time: August 24th, 2023 at 0555 Delivery: Called to room and patient was complete and pushing in  H&K position. Head delivered OA. No nuchal cord present. Shoulder and body delivered in usual fashion with the assistance of the FOB. Infant with spontaneous cry, placed on mother's abdomen, dried and stimulated. Cord clamped x 2 after it had stopped pulsing and had blanched, and was cut by FOB. Cord blood drawn. Placenta delivered spontaneously with gentle cord traction. Fundus firm with massage and Pitocin. Labia, perineum, vagina, and cervix inspected and found to be intact.   Placenta: spont, intact; home with patient Complications: none Lacerations: none EBL: 181cc Analgesia: epidural  Postpartum Planning [x]  message to sent to schedule follow-up   Infant: boy  APGARs 9/10  2660g (5lb 13.8oz)  07-08-1988, CNM  04/16/2022 6:08 AM

## 2021-10-03 LAB — OB RESULTS CONSOLE RUBELLA ANTIBODY, IGM: Rubella: IMMUNE

## 2021-10-03 LAB — OB RESULTS CONSOLE RPR: RPR: NONREACTIVE

## 2021-10-03 LAB — OB RESULTS CONSOLE HEPATITIS B SURFACE ANTIGEN: Hepatitis B Surface Ag: NEGATIVE

## 2021-10-03 LAB — OB RESULTS CONSOLE HIV ANTIBODY (ROUTINE TESTING): HIV: NONREACTIVE

## 2021-10-09 ENCOUNTER — Encounter: Payer: Self-pay | Admitting: Obstetrics and Gynecology

## 2021-10-17 ENCOUNTER — Encounter: Payer: Medicaid Other | Admitting: Obstetrics and Gynecology

## 2021-10-20 NOTE — Progress Notes (Signed)
Patient did not keep her visit today.  Duane Lope, NP 10/20/2021 10:53 AM

## 2021-10-31 ENCOUNTER — Other Ambulatory Visit: Payer: Self-pay

## 2021-10-31 ENCOUNTER — Encounter: Payer: Self-pay | Admitting: Certified Nurse Midwife

## 2021-10-31 ENCOUNTER — Ambulatory Visit (INDEPENDENT_AMBULATORY_CARE_PROVIDER_SITE_OTHER): Payer: 59 | Admitting: Certified Nurse Midwife

## 2021-10-31 VITALS — BP 117/78 | HR 84 | Wt 198.0 lb

## 2021-10-31 DIAGNOSIS — O99210 Obesity complicating pregnancy, unspecified trimester: Secondary | ICD-10-CM | POA: Insufficient documentation

## 2021-10-31 DIAGNOSIS — B009 Herpesviral infection, unspecified: Secondary | ICD-10-CM

## 2021-10-31 DIAGNOSIS — O98312 Other infections with a predominantly sexual mode of transmission complicating pregnancy, second trimester: Secondary | ICD-10-CM | POA: Diagnosis not present

## 2021-10-31 DIAGNOSIS — O99212 Obesity complicating pregnancy, second trimester: Secondary | ICD-10-CM | POA: Diagnosis not present

## 2021-10-31 DIAGNOSIS — O09529 Supervision of elderly multigravida, unspecified trimester: Secondary | ICD-10-CM | POA: Insufficient documentation

## 2021-10-31 DIAGNOSIS — Z3A14 14 weeks gestation of pregnancy: Secondary | ICD-10-CM | POA: Diagnosis not present

## 2021-10-31 DIAGNOSIS — O0990 Supervision of high risk pregnancy, unspecified, unspecified trimester: Secondary | ICD-10-CM

## 2021-10-31 DIAGNOSIS — O0942 Supervision of pregnancy with grand multiparity, second trimester: Secondary | ICD-10-CM | POA: Diagnosis not present

## 2021-10-31 DIAGNOSIS — O09522 Supervision of elderly multigravida, second trimester: Secondary | ICD-10-CM | POA: Diagnosis not present

## 2021-10-31 DIAGNOSIS — O0992 Supervision of high risk pregnancy, unspecified, second trimester: Secondary | ICD-10-CM | POA: Diagnosis not present

## 2021-10-31 MED ORDER — ASPIRIN 81 MG PO CHEW: 81.0000 mg | CHEWABLE_TABLET | Freq: Every day | ORAL | 3 refills | Status: DC

## 2021-10-31 NOTE — Progress Notes (Signed)
Prenatal labs and [id number] scanned under media ?

## 2021-10-31 NOTE — Progress Notes (Signed)
?  ? ?Subjective:  ? ?Wendy Juarez is a 36 y.o. [id number] at [redacted]w[redacted]d by early ultrasound being seen today for her first obstetrical visit.  Her obstetrical history is significant for advanced maternal age, obesity, and previous IUFD, grand multiparity . Patient does intend to breast feed. Pregnancy history fully reviewed. ? ?Patient reports no complaints. ? ?HISTORY: ?OB History  ?Gravida Para Term Preterm AB Living  ?9 8 7 1  0 7  ?SAB IAB Ectopic Multiple Live Births  ?0 0 0 0 0  ?  ?# Outcome Date GA Lbr Len/2nd Weight Sex Delivery Anes PTL Lv  ?9 Current           ?8 Term 2022 [redacted]w[redacted]d   M Vag-Spont     ?7 Term 2021 [redacted]w[redacted]d   [name]   FD  ?6 Term 2019 [redacted]w[redacted]d   M Vag-Spont   FD  ?5 Term 2017 [redacted]w[redacted]d   M Vag-Spont     ?4 Term 71 [redacted]w[redacted]d   F Vag-Spont     ?3 Term 2012 [redacted]w[redacted]d   F Vag-Spont     ?2 Term 2010 [redacted]w[redacted]d   F Vag-Spont     ?1 Preterm 2008 [redacted]w[redacted]d   F Vag-Spont     ? ?Past Medical History:  ?Diagnosis Date  ? Depression   ? ?Past Surgical History:  ?Procedure Laterality Date  ? CYSTECTOMY    ? ?Family History  ?Problem Relation Age of Onset  ? Breast cancer Maternal Grandmother   ? ?Social History  ? ?Tobacco Use  ? Smoking status: Never  ? Smokeless tobacco: Never  ?Vaping Use  ? Vaping Use: Never used  ?Substance Use Topics  ? Alcohol use: Never  ? Drug use: Never  ? ?No Known Allergies ?Current Outpatient Medications on File Prior to Visit  ?Medication Sig Dispense Refill  ? cholecalciferol (VITAMIN D3) 25 MCG (1000 UNIT) tablet Take 1,000 Units by mouth daily.    ? magnesium 30 MG tablet Take 30 mg by mouth 2 (two) times daily.    ? PRENATAL VIT-FE FUMARATE-FA PO Take by mouth.    ? ?No current facility-administered medications on file prior to visit.  ? ? ? Indications for ASA therapy (per uptodate) ?One of the following: ?Previous pregnancy with preeclampsia, especially early onset and with an adverse outcome No ?Multifetal gestation No ?Chronic hypertension No ?Type 1 or 2 diabetes mellitus No ?Chronic  kidney disease No ?Autoimmune disease (antiphospholipid syndrome, systemic lupus erythematosus) No ? ? Two or more of the following: ?Nulliparity No ?Obesity (body mass index >30 kg/m2) Yes ?Family history of preeclampsia in mother or sister No ?Age ?35 years No ?Sociodemographic characteristics (African American race, low socioeconomic level) Yes ?Personal risk factors (eg, previous pregnancy with low birth weight or small for gestational age infant, previous adverse pregnancy outcome [eg, stillbirth], interval >10 years between pregnancies) Yes ? ? Indications for early 1 hour GTT (per uptodate) ? ?BMI >25 (>23 in Asian women) AND one of the following ? ?Gestational diabetes mellitus in a previous pregnancy No ?Glycated hemoglobin ?5.7 percent (39 mmol/mol), impaired glucose tolerance, or impaired fasting glucose on previous testing No ?First-degree relative with diabetes No ?High-risk race/ethnicity (eg, African American, Latino, Native American, [place] American, [place]) Yes ?History of cardiovascular disease No ?Hypertension or on therapy for hypertension No ?High-density lipoprotein cholesterol level <35 mg/dL (0.90 mmol/L) and/or a triglyceride level >250 mg/dL (2.82 mmol/L) No ?Polycystic ovary syndrome No ?Physical inactivity No ?Other clinical condition associated with insulin  resistance (eg, severe obesity, acanthosis nigricans) Yes ?Previous birth of an infant weighing ?4000 g No ?Previous stillbirth of unknown cause Yes ? ? ?Exam  ? ?Vitals:  ? 10/31/21 0916  ?BP: 117/78  ?Pulse: 84  ?Weight: 198 lb (89.8 kg)  ? ?Fetal Heart Rate (bpm): 155 ? ?Uterus:     ?Pelvic Exam: Perineum:   ? Vulva:   ? Vagina:    ? Cervix:   ? Adnexa:   ? Bony Pelvis:   ?System: General: well-developed, well-nourished female in no acute distress  ? Breast:    ? Skin: normal coloration and turgor, no rashes  ? Neurologic: oriented, normal, negative, normal mood  ? Extremities: normal strength, tone, and muscle mass, ROM  of all joints is normal  ? HEENT extraocular movement intact and sclera clear, anicteric  ? Mouth/Teeth mucous membranes moist, pharynx normal without lesions and dental hygiene good  ? Neck supple and no masses  ? Cardiovascular: regular rate   ? Respiratory:  no respiratory distress  ? Abdomen:   ? ?  ?Assessment:  ? ?Pregnancy: [id number] ?Patient Active Problem List  ? Diagnosis Date Noted  ? Supervision of high risk pregnancy, antepartum 10/31/2021  ? Obesity affecting pregnancy 10/31/2021  ? AMA (advanced maternal age) multigravida 35+ 10/31/2021  ? Rh negative, antepartum 01/02/2021  ? IUFD at 57 weeks or more of gestation 03/17/2020  ? 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 Obesity (BMI 30-39.9) 07/11/2019  ? ?  ?Plan:  ?1. Supervision of high risk pregnancy, antepartum ?- Hemoglobin A1c ?- [place] MFM OB DETAIL +14 WK; Future ?- prenatal labs under media ? ?2. [redacted] weeks gestation of pregnancy ? ?3. Obesity affecting pregnancy in second trimester ? ?4. Multigravida of advanced maternal age in second trimester  ? ?5. Previous term IUFD ? ? ? ?Initial labs drawn. ?Add A1C today ?Started on ASA yes ?Flu vax n/a ?Continue prenatal vitamins. ?Genetic Screening discussed, NIPS:  declines AFP . ?Ultrasound discussed; fetal anatomic survey: requested. ?Problem list reviewed and updated ?The nature of [facility] with multiple MDs and other Advanced Practice Providers was explained to patient; also emphasized that fellows, residents, and students are part of our team. ?Routine obstetric precautions reviewed ?Return in about 6 weeks (around 12/12/2021). ? ? ?[name] ?12:27 PM 10/31/21  ? ?

## 2021-11-01 LAB — HEMOGLOBIN A1C
Hgb A1c MFr Bld: 5.3 % of total Hgb (ref <5.7)
Mean Plasma Glucose: 105 mg/dL
eAG (mmol/L): 5.8 mmol/L

## 2021-11-03 ENCOUNTER — Encounter: Payer: Self-pay | Admitting: Certified Nurse Midwife

## 2021-12-04 ENCOUNTER — Ambulatory Visit (INDEPENDENT_AMBULATORY_CARE_PROVIDER_SITE_OTHER): Payer: 59 | Admitting: Obstetrics and Gynecology

## 2021-12-04 ENCOUNTER — Encounter: Payer: Self-pay | Admitting: Obstetrics and Gynecology

## 2021-12-04 VITALS — BP 120/73 | HR 84 | Wt 196.0 lb

## 2021-12-04 DIAGNOSIS — Z789 Other specified health status: Secondary | ICD-10-CM

## 2021-12-04 DIAGNOSIS — O26892 Other specified pregnancy related conditions, second trimester: Secondary | ICD-10-CM

## 2021-12-04 DIAGNOSIS — Z3A18 18 weeks gestation of pregnancy: Secondary | ICD-10-CM

## 2021-12-04 DIAGNOSIS — O0992 Supervision of high risk pregnancy, unspecified, second trimester: Secondary | ICD-10-CM

## 2021-12-04 DIAGNOSIS — O364XX0 Maternal care for intrauterine death, not applicable or unspecified: Secondary | ICD-10-CM | POA: Diagnosis not present

## 2021-12-04 DIAGNOSIS — Z8619 Personal history of other infectious and parasitic diseases: Secondary | ICD-10-CM

## 2021-12-04 DIAGNOSIS — Z6791 Unspecified blood type, Rh negative: Secondary | ICD-10-CM

## 2021-12-04 DIAGNOSIS — E669 Obesity, unspecified: Secondary | ICD-10-CM

## 2021-12-04 DIAGNOSIS — O0990 Supervision of high risk pregnancy, unspecified, unspecified trimester: Secondary | ICD-10-CM

## 2021-12-04 DIAGNOSIS — O09522 Supervision of elderly multigravida, second trimester: Secondary | ICD-10-CM

## 2021-12-04 DIAGNOSIS — O26899 Other specified pregnancy related conditions, unspecified trimester: Secondary | ICD-10-CM

## 2021-12-04 NOTE — Progress Notes (Signed)
? ?  PRENATAL VISIT NOTE ? ?Subjective:  ?Wendy Juarez is a 36 y.o. [id number] at [redacted]w[redacted]d being seen today for ongoing prenatal care.  She is currently monitored for the following issues for this high-risk pregnancy and has History of herpes genitalis; IUFD at 15 weeks or more of gestation; Obesity (BMI 30-39.9); Rh negative, antepartum; Supervision of high risk pregnancy, antepartum; Obesity affecting pregnancy; AMA (advanced maternal age) multigravida 35+; and No blood products on their problem list. ? ?Patient reports fatigue.  Contractions: Not present. Vag. Bleeding: None.  Movement: Absent. Denies leaking of fluid.  ? ?The following portions of the patient's history were reviewed and updated as appropriate: allergies, current medications, past family history, past medical history, past social history, past surgical history and problem list.  ? ?Objective:  ? ?Vitals:  ? 12/04/21 0917  ?BP: 120/73  ?Pulse: 84  ?Weight: 196 lb (88.9 kg)  ? ? ?Fetal Status: Fetal Heart Rate (bpm): 145   Movement: Absent    ? ?General:  Alert, oriented and cooperative. Patient is in no acute distress.  ?Skin: Skin is warm and dry. No rash noted.   ?Cardiovascular: Normal heart rate noted  ?Respiratory: Normal respiratory effort, no problems with respiration noted  ?Abdomen: Soft, gravid, appropriate for gestational age.  Pain/Pressure: Absent     ?Pelvic: Cervical exam deferred        ?Extremities: Normal range of motion.  Edema: None  ?Mental Status: Normal mood and affect. Normal behavior. Normal judgment and thought content.  ? ?Assessment and Plan:  ?Pregnancy: [id number] at [redacted]w[redacted]d ?1. IUFD at 40 weeks or more of gestation ? ?2. Obesity (BMI 30-39.9) ?Start baby ASA ? ?3. Rh negative, antepartum ?Patient declines Rho gam ?Antibody screen today ? ?4. Supervision of high risk pregnancy, antepartum ? ?5. Multigravida of advanced maternal age in second trimester ? ?6. History of herpes genitalis ?Ppx 35 weeks ? ?7. [redacted] weeks gestation  of pregnancy ? ?8. No blood products ?Patient declines Rho gam and blood products, however states she would consider a blood transfusion if her life were in danger, would want to discuss at that point ? ? ?Preterm labor symptoms and general obstetric precautions including but not limited to vaginal bleeding, contractions, leaking of fluid and fetal movement were reviewed in detail with the patient. ?Please refer to After Visit Summary for other counseling recommendations.  ? ?Return in about 4 weeks (around 01/01/2022) for high OB. ? ?Future Appointments  ?Date Time Provider [place]  ?12/05/2021  9:15 AM [facility] NURSE [facility]  ?12/05/2021  9:30 AM [facility] US2 [facility]  ? ? ?Sloan Leiter, MD ? ?

## 2021-12-05 ENCOUNTER — Ambulatory Visit: Payer: 59 | Admitting: *Deleted

## 2021-12-05 ENCOUNTER — Encounter: Payer: Self-pay | Admitting: *Deleted

## 2021-12-05 ENCOUNTER — Encounter: Payer: Self-pay | Admitting: Certified Nurse Midwife

## 2021-12-05 ENCOUNTER — Encounter: Payer: 59 | Admitting: Women's Health

## 2021-12-05 ENCOUNTER — Ambulatory Visit: Payer: 59 | Attending: Certified Nurse Midwife

## 2021-12-05 ENCOUNTER — Other Ambulatory Visit: Payer: Self-pay | Admitting: *Deleted

## 2021-12-05 VITALS — BP 113/49 | HR 79

## 2021-12-05 DIAGNOSIS — Z8619 Personal history of other infectious and parasitic diseases: Secondary | ICD-10-CM | POA: Diagnosis present

## 2021-12-05 DIAGNOSIS — O09522 Supervision of elderly multigravida, second trimester: Secondary | ICD-10-CM | POA: Diagnosis present

## 2021-12-05 DIAGNOSIS — Z3A18 18 weeks gestation of pregnancy: Secondary | ICD-10-CM

## 2021-12-05 DIAGNOSIS — O0990 Supervision of high risk pregnancy, unspecified, unspecified trimester: Secondary | ICD-10-CM

## 2021-12-05 DIAGNOSIS — O0992 Supervision of high risk pregnancy, unspecified, second trimester: Secondary | ICD-10-CM | POA: Diagnosis not present

## 2021-12-05 DIAGNOSIS — O359XX0 Maternal care for (suspected) fetal abnormality and damage, unspecified, not applicable or unspecified: Secondary | ICD-10-CM

## 2021-12-05 DIAGNOSIS — O99212 Obesity complicating pregnancy, second trimester: Secondary | ICD-10-CM | POA: Insufficient documentation

## 2021-12-05 DIAGNOSIS — Z6835 Body mass index (BMI) 35.0-35.9, adult: Secondary | ICD-10-CM

## 2021-12-05 DIAGNOSIS — Z8759 Personal history of other complications of pregnancy, childbirth and the puerperium: Secondary | ICD-10-CM

## 2021-12-08 ENCOUNTER — Encounter: Payer: Self-pay | Admitting: *Deleted

## 2022-01-02 ENCOUNTER — Encounter: Payer: 59 | Admitting: Certified Nurse Midwife

## 2022-01-16 ENCOUNTER — Ambulatory Visit: Payer: 59

## 2022-01-16 ENCOUNTER — Ambulatory Visit: Payer: 59 | Attending: Obstetrics and Gynecology

## 2022-02-06 ENCOUNTER — Encounter: Payer: Self-pay | Admitting: Certified Nurse Midwife

## 2022-02-20 ENCOUNTER — Ambulatory Visit: Payer: 59 | Admitting: *Deleted

## 2022-02-20 ENCOUNTER — Other Ambulatory Visit: Payer: Self-pay | Admitting: *Deleted

## 2022-02-20 ENCOUNTER — Ambulatory Visit: Payer: 59 | Attending: Obstetrics and Gynecology

## 2022-02-20 VITALS — BP 121/55 | HR 82

## 2022-02-20 DIAGNOSIS — O99213 Obesity complicating pregnancy, third trimester: Secondary | ICD-10-CM | POA: Insufficient documentation

## 2022-02-20 DIAGNOSIS — O0943 Supervision of pregnancy with grand multiparity, third trimester: Secondary | ICD-10-CM

## 2022-02-20 DIAGNOSIS — Z3A29 29 weeks gestation of pregnancy: Secondary | ICD-10-CM

## 2022-02-20 DIAGNOSIS — O359XX0 Maternal care for (suspected) fetal abnormality and damage, unspecified, not applicable or unspecified: Secondary | ICD-10-CM | POA: Insufficient documentation

## 2022-02-20 DIAGNOSIS — O09523 Supervision of elderly multigravida, third trimester: Secondary | ICD-10-CM

## 2022-02-20 DIAGNOSIS — Z8619 Personal history of other infectious and parasitic diseases: Secondary | ICD-10-CM | POA: Diagnosis present

## 2022-02-20 DIAGNOSIS — O0990 Supervision of high risk pregnancy, unspecified, unspecified trimester: Secondary | ICD-10-CM

## 2022-02-20 DIAGNOSIS — O09213 Supervision of pregnancy with history of pre-term labor, third trimester: Secondary | ICD-10-CM | POA: Diagnosis not present

## 2022-02-20 DIAGNOSIS — O35BXX0 Maternal care for other (suspected) fetal abnormality and damage, fetal cardiac anomalies, not applicable or unspecified: Secondary | ICD-10-CM

## 2022-02-20 DIAGNOSIS — Z8759 Personal history of other complications of pregnancy, childbirth and the puerperium: Secondary | ICD-10-CM | POA: Diagnosis present

## 2022-02-20 DIAGNOSIS — O09293 Supervision of pregnancy with other poor reproductive or obstetric history, third trimester: Secondary | ICD-10-CM

## 2022-02-20 DIAGNOSIS — O09522 Supervision of elderly multigravida, second trimester: Secondary | ICD-10-CM | POA: Diagnosis present

## 2022-02-20 DIAGNOSIS — E669 Obesity, unspecified: Secondary | ICD-10-CM

## 2022-02-20 DIAGNOSIS — O360130 Maternal care for anti-D [Rh] antibodies, third trimester, not applicable or unspecified: Secondary | ICD-10-CM

## 2022-02-20 DIAGNOSIS — Z6835 Body mass index (BMI) 35.0-35.9, adult: Secondary | ICD-10-CM | POA: Insufficient documentation

## 2022-02-23 ENCOUNTER — Encounter: Payer: Self-pay | Admitting: Certified Nurse Midwife

## 2022-02-25 ENCOUNTER — Ambulatory Visit (INDEPENDENT_AMBULATORY_CARE_PROVIDER_SITE_OTHER): Payer: 59 | Admitting: *Deleted

## 2022-02-25 DIAGNOSIS — M545 Low back pain, unspecified: Secondary | ICD-10-CM

## 2022-02-25 LAB — POCT URINALYSIS DIPSTICK
Bilirubin, UA: NEGATIVE
Blood, UA: NEGATIVE
Glucose, UA: NEGATIVE
Ketones, UA: NEGATIVE
Leukocytes, UA: NEGATIVE
Nitrite, UA: NEGATIVE
Protein, UA: NEGATIVE
Spec Grav, UA: 1.02 (ref 1.010–1.025)
Urobilinogen, UA: NEGATIVE E.U./dL — AB
pH, UA: 6.5 (ref 5.0–8.0)

## 2022-02-25 NOTE — Progress Notes (Cosign Needed)
Pt is here stating that she has been having lower back pain for about 5 days.  Denies any pain with urination.  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  Urine dip today is remarkably normal.  Will send culture also. Pt states that she does have a maternity belt but doesn't also wear it.  Advised to wear when up during the day.  She does have a ROB on Friday.  Will reassess at that time.

## 2022-02-27 ENCOUNTER — Ambulatory Visit (INDEPENDENT_AMBULATORY_CARE_PROVIDER_SITE_OTHER): Payer: 59

## 2022-02-27 VITALS — BP 116/72 | HR 92 | Wt 195.0 lb

## 2022-02-27 DIAGNOSIS — Z8619 Personal history of other infectious and parasitic diseases: Secondary | ICD-10-CM

## 2022-02-27 DIAGNOSIS — Z3A30 30 weeks gestation of pregnancy: Secondary | ICD-10-CM | POA: Diagnosis not present

## 2022-02-27 DIAGNOSIS — R55 Syncope and collapse: Secondary | ICD-10-CM

## 2022-02-27 DIAGNOSIS — O0990 Supervision of high risk pregnancy, unspecified, unspecified trimester: Secondary | ICD-10-CM

## 2022-02-27 DIAGNOSIS — Z8759 Personal history of other complications of pregnancy, childbirth and the puerperium: Secondary | ICD-10-CM

## 2022-02-27 DIAGNOSIS — Z6791 Unspecified blood type, Rh negative: Secondary | ICD-10-CM

## 2022-02-27 DIAGNOSIS — O0993 Supervision of high risk pregnancy, unspecified, third trimester: Secondary | ICD-10-CM

## 2022-02-27 DIAGNOSIS — R42 Dizziness and giddiness: Secondary | ICD-10-CM

## 2022-02-27 DIAGNOSIS — O26899 Other specified pregnancy related conditions, unspecified trimester: Secondary | ICD-10-CM

## 2022-02-27 DIAGNOSIS — O0933 Supervision of pregnancy with insufficient antenatal care, third trimester: Secondary | ICD-10-CM | POA: Diagnosis not present

## 2022-02-27 DIAGNOSIS — O09523 Supervision of elderly multigravida, third trimester: Secondary | ICD-10-CM

## 2022-02-27 DIAGNOSIS — O99213 Obesity complicating pregnancy, third trimester: Secondary | ICD-10-CM

## 2022-02-27 NOTE — Progress Notes (Signed)
Ask about Ab screen

## 2022-02-27 NOTE — Progress Notes (Signed)
PRENATAL VISIT NOTE  Subjective:  Wendy Juarez is a 36 y.o. [id number] at [redacted]w[redacted]d being seen today for ongoing prenatal care.  She is currently monitored for the following issues for this high-risk pregnancy and has History of herpes genitalis; IUFD at 20 weeks or more of gestation; Obesity (BMI 30-39.9); Rh negative, antepartum; Supervision of high risk pregnancy, antepartum; Obesity affecting pregnancy; AMA (advanced maternal age) multigravida 35+; No blood products; and Fetal abnormality in pregnancy on their problem list.  Patient reports several episodes of feeling like she was going to pass out. She reports during these episodes she feels like heart is pounding out of her chest. She reports BP was low and heart rate was "really high". Last episode was about 2 weeks ago. Concerned as this has been a recurrent issue. Contractions: Not present. Vag. Bleeding: None.  Movement: Present. Denies leaking of fluid.   The following portions of the patient's history were reviewed and updated as appropriate: allergies, current medications, past family history, past medical history, past social history, past surgical history and problem list.   Objective:   Vitals:   02/27/22 1014  BP: 116/72  Pulse: 92  Weight: 195 lb (88.5 kg)    Fetal Status: Fetal Heart Rate (bpm): 141 Fundal Height: 31 cm Movement: Present     General:  Alert, oriented and cooperative. Patient is in no acute distress.  Skin: Skin is warm and dry. No rash noted.   Cardiovascular: Normal heart rate noted  Respiratory: Normal respiratory effort, no problems with respiration noted  Abdomen: Soft, gravid, appropriate for gestational age.  Pain/Pressure: Absent     Pelvic: Cervical exam deferred        Extremities: Normal range of motion.  Edema: None  Mental Status: Normal mood and affect. Normal behavior. Normal judgment and thought content.   Assessment and Plan:  Pregnancy: [id number] at [redacted]w[redacted]d 1. Supervision of high risk  pregnancy, antepartum - Routine OB - No prenatal care since 18 weeks - Reviewed Rhogam and GTT. Patient declines both. Okay with checking blood sugar x2 weeks. Instructed patient to check fasting and 2 hr PP and bring log to next visit - Will do labs today  - CBC - RPR - Antibody screen - Ferritin  2. [redacted] weeks gestation of pregnancy - Endorses active fetal movement - FH appropriate  3. Limited prenatal care in third trimester - No prenatal care since 18 weeks  4. History of IUFD - Hx of term IUFD - Growth [place] on 6/30, EFW 47%, AFI wnl - Growth scans and BPP weekly per MFM recommendations  5. AMA (advanced maternal age) multigravida 35+, third trimester - MaterniT21 negative - Declined AFP  6. Obesity affecting pregnancy in third trimester - not taking bASA  7. History of herpes genitalis - Valtrex ppx at 35 weeks  8. Postural dizziness with presyncope  - AMB Referral to Cardio Obstetrics  9. Rh negative, antepartum - Declines Rhogam and blood products - Antibody screen today   Preterm labor symptoms and general obstetric precautions including but not limited to vaginal bleeding, contractions, leaking of fluid and fetal movement were reviewed in detail with the patient. Please refer to After Visit Summary for other counseling recommendations.   Return in about 2 weeks (around 03/13/2022).  Future Appointments  Date Time Provider Department Center  03/13/2022 11:10 AM Donette Larry, CNM [facility]  03/20/2022  9:45 AM [facility] NURSE [facility]  03/20/2022 10:00 AM [facility] US1 [facility]  03/27/2022  8:30 AM [facility] NURSE [facility]  03/27/2022  8:45 AM [facility] US6 [facility]  04/03/2022  8:30 AM [facility] NURSE [facility]  04/03/2022  8:45 AM [facility] US4 [facility]  04/10/2022  8:30 AM [facility] NURSE [facility]  04/10/2022  8:45 AM [facility] US4 [facility]    Brand Males, CNM

## 2022-02-27 NOTE — Progress Notes (Signed)
error 

## 2022-02-28 LAB — URINE CULTURE, OB REFLEX

## 2022-02-28 LAB — CBC
HCT: 34.1 % — ABNORMAL LOW (ref 35.0–45.0)
Hemoglobin: 11 g/dL — ABNORMAL LOW (ref 11.7–15.5)
MCH: 27.2 pg (ref 27.0–33.0)
MCHC: 32.3 g/dL (ref 32.0–36.0)
MCV: 84.4 fL (ref 80.0–100.0)
MPV: 9.7 fL (ref 7.5–12.5)
Platelets: 252 10*3/uL (ref 140–400)
RBC: 4.04 10*6/uL (ref 3.80–5.10)
RDW: 14.2 % (ref 11.0–15.0)
WBC: 5.7 10*3/uL (ref 3.8–10.8)

## 2022-02-28 LAB — RPR: RPR Ser Ql: NONREACTIVE

## 2022-02-28 LAB — ANTIBODY SCREEN: Antibody Screen: NOT DETECTED

## 2022-02-28 LAB — CULTURE, OB URINE

## 2022-02-28 LAB — FERRITIN: Ferritin: 6 ng/mL — ABNORMAL LOW (ref 16–154)

## 2022-03-13 ENCOUNTER — Encounter: Payer: 59 | Admitting: Certified Nurse Midwife

## 2022-03-13 ENCOUNTER — Encounter: Payer: Self-pay | Admitting: Certified Nurse Midwife

## 2022-03-20 ENCOUNTER — Ambulatory Visit: Payer: 59 | Attending: Obstetrics and Gynecology

## 2022-03-20 ENCOUNTER — Encounter: Payer: Self-pay | Admitting: *Deleted

## 2022-03-20 ENCOUNTER — Ambulatory Visit: Payer: 59 | Admitting: *Deleted

## 2022-03-20 VITALS — BP 130/62 | HR 75

## 2022-03-20 DIAGNOSIS — O0943 Supervision of pregnancy with grand multiparity, third trimester: Secondary | ICD-10-CM

## 2022-03-20 DIAGNOSIS — O359XX0 Maternal care for (suspected) fetal abnormality and damage, unspecified, not applicable or unspecified: Secondary | ICD-10-CM | POA: Insufficient documentation

## 2022-03-20 DIAGNOSIS — O99213 Obesity complicating pregnancy, third trimester: Secondary | ICD-10-CM | POA: Insufficient documentation

## 2022-03-20 DIAGNOSIS — O0990 Supervision of high risk pregnancy, unspecified, unspecified trimester: Secondary | ICD-10-CM

## 2022-03-20 DIAGNOSIS — Z8759 Personal history of other complications of pregnancy, childbirth and the puerperium: Secondary | ICD-10-CM | POA: Diagnosis present

## 2022-03-20 DIAGNOSIS — O09523 Supervision of elderly multigravida, third trimester: Secondary | ICD-10-CM | POA: Diagnosis present

## 2022-03-20 DIAGNOSIS — O35BXX0 Maternal care for other (suspected) fetal abnormality and damage, fetal cardiac anomalies, not applicable or unspecified: Secondary | ICD-10-CM

## 2022-03-20 DIAGNOSIS — O09293 Supervision of pregnancy with other poor reproductive or obstetric history, third trimester: Secondary | ICD-10-CM

## 2022-03-20 DIAGNOSIS — O360130 Maternal care for anti-D [Rh] antibodies, third trimester, not applicable or unspecified: Secondary | ICD-10-CM | POA: Diagnosis not present

## 2022-03-20 DIAGNOSIS — Z8619 Personal history of other infectious and parasitic diseases: Secondary | ICD-10-CM | POA: Diagnosis present

## 2022-03-20 DIAGNOSIS — O09213 Supervision of pregnancy with history of pre-term labor, third trimester: Secondary | ICD-10-CM | POA: Diagnosis not present

## 2022-03-20 DIAGNOSIS — Z3A33 33 weeks gestation of pregnancy: Secondary | ICD-10-CM

## 2022-03-20 DIAGNOSIS — E669 Obesity, unspecified: Secondary | ICD-10-CM

## 2022-03-27 ENCOUNTER — Ambulatory Visit: Payer: 59

## 2022-03-27 ENCOUNTER — Ambulatory Visit: Payer: 59 | Attending: Certified Nurse Midwife

## 2022-03-27 ENCOUNTER — Encounter: Payer: Self-pay | Admitting: Cardiology

## 2022-03-27 ENCOUNTER — Ambulatory Visit: Payer: 59 | Admitting: Cardiology

## 2022-04-03 ENCOUNTER — Ambulatory Visit: Payer: 59 | Attending: Obstetrics and Gynecology

## 2022-04-03 ENCOUNTER — Ambulatory Visit: Payer: 59 | Admitting: *Deleted

## 2022-04-03 VITALS — BP 118/56 | HR 76

## 2022-04-03 DIAGNOSIS — O359XX0 Maternal care for (suspected) fetal abnormality and damage, unspecified, not applicable or unspecified: Secondary | ICD-10-CM | POA: Diagnosis present

## 2022-04-03 DIAGNOSIS — O0990 Supervision of high risk pregnancy, unspecified, unspecified trimester: Secondary | ICD-10-CM

## 2022-04-03 DIAGNOSIS — O09523 Supervision of elderly multigravida, third trimester: Secondary | ICD-10-CM | POA: Diagnosis present

## 2022-04-03 DIAGNOSIS — Z8619 Personal history of other infectious and parasitic diseases: Secondary | ICD-10-CM

## 2022-04-03 DIAGNOSIS — Z8759 Personal history of other complications of pregnancy, childbirth and the puerperium: Secondary | ICD-10-CM | POA: Diagnosis present

## 2022-04-03 DIAGNOSIS — O99213 Obesity complicating pregnancy, third trimester: Secondary | ICD-10-CM

## 2022-04-10 ENCOUNTER — Ambulatory Visit: Payer: 59 | Attending: Obstetrics and Gynecology

## 2022-04-10 ENCOUNTER — Ambulatory Visit (INDEPENDENT_AMBULATORY_CARE_PROVIDER_SITE_OTHER): Payer: 59 | Admitting: Cardiology

## 2022-04-10 ENCOUNTER — Encounter: Payer: Self-pay | Admitting: Cardiology

## 2022-04-10 ENCOUNTER — Other Ambulatory Visit: Payer: Self-pay | Admitting: *Deleted

## 2022-04-10 ENCOUNTER — Ambulatory Visit: Payer: 59 | Admitting: *Deleted

## 2022-04-10 ENCOUNTER — Ambulatory Visit (INDEPENDENT_AMBULATORY_CARE_PROVIDER_SITE_OTHER): Payer: 59

## 2022-04-10 VITALS — BP 138/66 | HR 84 | Ht 63.0 in | Wt 201.0 lb

## 2022-04-10 VITALS — BP 118/61 | HR 78

## 2022-04-10 DIAGNOSIS — O99213 Obesity complicating pregnancy, third trimester: Secondary | ICD-10-CM | POA: Insufficient documentation

## 2022-04-10 DIAGNOSIS — R55 Syncope and collapse: Secondary | ICD-10-CM | POA: Diagnosis not present

## 2022-04-10 DIAGNOSIS — O359XX0 Maternal care for (suspected) fetal abnormality and damage, unspecified, not applicable or unspecified: Secondary | ICD-10-CM | POA: Insufficient documentation

## 2022-04-10 DIAGNOSIS — R42 Dizziness and giddiness: Secondary | ICD-10-CM | POA: Diagnosis not present

## 2022-04-10 DIAGNOSIS — O0990 Supervision of high risk pregnancy, unspecified, unspecified trimester: Secondary | ICD-10-CM | POA: Diagnosis present

## 2022-04-10 DIAGNOSIS — Z6835 Body mass index (BMI) 35.0-35.9, adult: Secondary | ICD-10-CM

## 2022-04-10 DIAGNOSIS — Z8759 Personal history of other complications of pregnancy, childbirth and the puerperium: Secondary | ICD-10-CM | POA: Insufficient documentation

## 2022-04-10 DIAGNOSIS — O09523 Supervision of elderly multigravida, third trimester: Secondary | ICD-10-CM

## 2022-04-10 DIAGNOSIS — Z8619 Personal history of other infectious and parasitic diseases: Secondary | ICD-10-CM | POA: Diagnosis present

## 2022-04-10 NOTE — Patient Instructions (Addendum)
Medication Instructions:  Your physician recommends that you continue on your current medications as directed. Please refer to the Current Medication list given to you today.  *If you need a refill on your cardiac medications before your next appointment, please call your pharmacy*   Lab Work: None  If you have labs (blood work) drawn today and your tests are completely normal, you will receive your results only by: MyChart Message (if you have MyChart) OR A paper copy in the mail If you have any lab test that is abnormal or we need to change your treatment, we will call you to review the results.   Testing/Procedures: Your physician has requested that you have an echocardiogram. Echocardiography is a painless test that uses sound waves to create images of your heart. It provides your doctor with information about the size and shape of your heart and how well your heart's chambers and valves are working. This procedure takes approximately one hour. There are no restrictions for this procedure.  A zio monitor was ordered today. It will remain on for 3 days. You will then return monitor and event diary in provided box. It takes 1-2 weeks for report to be downloaded and returned to [place]. We will call you with the results. If monitor falls off or has orange flashing light, please call Zio for further instructions.    Returning your monitor  Remove your patch and place it inside the Gateway. In the lower half of the Gateway there is a white  bag with prepaid postage on it. Place Gateway in bag and seal. Mail package back to [place] as soon as  possible. Your physician should have your final report approximately 7 days after you have mailed back  your monitor. Call [facility] Customer Care at [phone] if you have questions regarding your ZIO AT  patch monitor. Call them immediately if you see an orange light blinking on your monitor.  If your monitor falls off in less than 3 days,  contact our Monitor department at [phone].     Follow-Up: At [facility], you and your health needs are our priority.  As part of our continuing mission to provide you with exceptional heart care, we have created designated Provider Care Teams.  These Care Teams include your primary Cardiologist (physician) and Advanced Practice Providers (APPs -  Physician Assistants and Nurse Practitioners) who all work together to provide you with the care you need, when you need it.  We recommend signing up for the patient portal called "MyChart".  Sign up information is provided on this After Visit Summary.  MyChart is used to connect with patients for Virtual Visits (Telemedicine).  Patients are able to view lab/test results, encounter notes, upcoming appointments, etc.  Non-urgent messages can be sent to your provider as well.   To learn more about what you can do with MyChart, go to [email or web address].    Your next appointment:   12 week(s)  The format for your next appointment:   In Person  Provider:   Thomasene Ripple, DO [address], [place], [place] [address]

## 2022-04-10 NOTE — Progress Notes (Unsigned)
Cardio-Obstetrics Clinic  New Evaluation  Date:  04/16/2022   ID:  [name], DOB 07-Jan-1986, MRN [id number]  PCP:  Patient, No Pcp Per   [facility] Providers Cardiologist:  Thomasene Ripple, DO  Electrophysiologist:  None       Referring MD: Brand Males, CNM   Chief Complaint: " I am doing   History of Present Illness:    Wendy Juarez is a 36 y.o. female [[id number]] who is being seen today for the evaluation of presyncope at the request of Brand Males, CNM.  Medical history includes the patient.  The patient is here today to be evaluated for intermittent lightheadedness.  She tells me that over the few months she has had some intermittent lightheadedness with the pregnancy.  This is not new.  Low back in June 2023 she notes that she was working outside it was  heart.  She was gardening.  She did start to feel as if she was depressed.  Her family brought her chair.  She sat down.  During that time she felt significantly lightheaded and she felt her heart was pounding.  She was awake.  She noticed that she had involuntary urination.  She denies adamantly that she ever passed out.  She denies any shaking or body movements. She admits that she was taken to her car where there is air conditioning she lie down and during that time she took her blood pressure was 90/50 with a heart rate of 120.  Eventually she recovered.  Recently she tells me that she has had intermittent palpitations and this is now new from the day she had that episode and some occasional dizziness.  No chest pain.  Prior CV Studies Reviewed: The following studies were reviewed today: None today  Past Medical History:  Diagnosis Date   Depression     Past Surgical History:  Procedure Laterality Date   CYSTECTOMY        OB History     Gravida  9   Para  9   Term  8   Preterm  1   AB  0   Living  8      SAB  0   IAB  0   Ectopic  0   Multiple  0   Live Births  8                Current Medications: Current Meds  Medication Sig   Cyanocobalamin (VITAMIN B12 PO) Take 1 tablet by mouth daily.   ferrous sulfate 325 (65 FE) MG EC tablet Take 325 mg by mouth daily.   [DISCONTINUED] cholecalciferol (VITAMIN D3) 25 MCG (1000 UNIT) tablet Take 1,000 Units by mouth daily.   [DISCONTINUED] magnesium 30 MG tablet Take 30 mg by mouth 2 (two) times daily.   [DISCONTINUED] PRENATAL VIT-FE FUMARATE-FA PO Take by mouth.     Allergies:   Patient has no known allergies.   Social History   Socioeconomic History   Marital status: Married    Spouse name: Not on file   Number of children: Not on file   Years of education: Not on file   Highest education level: Not on file  Occupational History   Not on file  Tobacco Use   Smoking status: Never   Smokeless tobacco: Never  Vaping Use   Vaping Use: Never used  Substance and Sexual Activity   Alcohol use: Never   Drug use: Never   Sexual activity: Yes  Birth control/protection: None  Other Topics Concern   Not on file  Social History Narrative   Not on file   Social Determinants of Health   Financial Resource Strain: Not on file  Food Insecurity: Not on file  Transportation Needs: Not on file  Physical Activity: Not on file  Stress: Not on file  Social Connections: Not on file      Family History  Problem Relation Age of Onset   Breast cancer Maternal Grandmother       ROS:   Please see the history of present illness.     Report dizziness but other ROS negative    Labs/EKG Reviewed:    EKG:   EKG is was ordered today.  The ekg ordered today demonstrates sinus rhythm, heart rate 84 bpm with nonspecific ST changes.  Recent Labs: 04/15/2022: Hemoglobin 12.7; Platelets 288   Recent Lipid Panel No results found for: "CHOL", "TRIG", "HDL", "CHOLHDL", "LDLCALC", "LDLDIRECT"  Physical Exam:    VS:  BP 138/66 (BP Location: Left Arm, Patient Position: Sitting, Cuff Size: Normal)    Pulse 84   Ht 5\' 3"  (1.6 m)   Wt 201 lb (91.2 kg)   SpO2 98%   BMI 35.61 kg/m     Wt Readings from Last 3 Encounters:  04/10/22 201 lb (91.2 kg)  02/27/22 195 lb (88.5 kg)  12/04/21 196 lb (88.9 kg)     GEN:  Well nourished, well developed in no acute distress HEENT: Normal NECK: No JVD; No carotid bruits LYMPHATICS: No lymphadenopathy CARDIAC: RRR, 3 out of 6 murmurs, rubs, gallops RESPIRATORY:  Clear to auscultation without rales, wheezing or rhonchi  ABDOMEN: Soft, non-tender, non-distended MUSCULOSKELETAL:  No edema; No deformity  SKIN: Warm and dry NEUROLOGIC:  Alert and oriented x 3 PSYCHIATRIC:  Normal affect    Risk Assessment/Risk Calculators:                 ASSESSMENT & PLAN:    Presyncope Lightheadedness  Her clinical exam is impressive for a very prominent midsystolic murmur.  With her symptoms I like to get an echocardiogram to make sure that there are no significant valvular pathology here. The involuntary urination really is not explained by cardiac phenomenon.  She may need to see neurology.  Although this does not sound like a seizure episode.  She will follow-up in 12 weeks.   Patient Instructions  Medication Instructions:  Your physician recommends that you continue on your current medications as directed. Please refer to the Current Medication list given to you today.  *If you need a refill on your cardiac medications before your next appointment, please call your pharmacy*   Lab Work: None  If you have labs (blood work) drawn today and your tests are completely normal, you will receive your results only by: MyChart Message (if you have MyChart) OR A paper copy in the mail If you have any lab test that is abnormal or we need to change your treatment, we will call you to review the results.   Testing/Procedures: Your physician has requested that you have an echocardiogram. Echocardiography is a painless test that uses sound waves to  create images of your heart. It provides your doctor with information about the size and shape of your heart and how well your heart's chambers and valves are working. This procedure takes approximately one hour. There are no restrictions for this procedure.  A zio monitor was ordered today. It will remain on for 3 days.  You will then return monitor and event diary in provided box. It takes 1-2 weeks for report to be downloaded and returned to [place]. We will call you with the results. If monitor falls off or has orange flashing light, please call Zio for further instructions.    Returning your monitor  Remove your patch and place it inside the Gateway. In the lower half of the Gateway there is a white  bag with prepaid postage on it. Place Gateway in bag and seal. Mail package back to [place] as soon as  possible. Your physician should have your final report approximately 7 days after you have mailed back  your monitor. Call [facility] Customer Care at [phone] if you have questions regarding your ZIO AT  patch monitor. Call them immediately if you see an orange light blinking on your monitor.  If your monitor falls off in less than 3 days, contact our Monitor department at [phone].     Follow-Up: At [facility], you and your health needs are our priority.  As part of our continuing mission to provide you with exceptional heart care, we have created designated Provider Care Teams.  These Care Teams include your primary Cardiologist (physician) and Advanced Practice Providers (APPs -  Physician Assistants and Nurse Practitioners) who all work together to provide you with the care you need, when you need it.  We recommend signing up for the patient portal called "MyChart".  Sign up information is provided on this After Visit Summary.  MyChart is used to connect with patients for Virtual Visits (Telemedicine).  Patients are able to view lab/test results, encounter notes,  upcoming appointments, etc.  Non-urgent messages can be sent to your provider as well.   To learn more about what you can do with MyChart, go to [email or web address].    Your next appointment:   12 week(s)  The format for your next appointment:   In Person  Provider:   Thomasene Ripple, DO [address], [place], [place] [address]   Dispo:  Return in about 12 weeks (around 07/03/2022).   Medication Adjustments/Labs and Tests Ordered: Current medicines are reviewed at length with the patient today.  Concerns regarding medicines are outlined above.  Tests Ordered: Orders Placed This Encounter  Procedures   LONG TERM MONITOR-LIVE TELEMETRY (3-14 DAYS)   EKG 12-Lead   ECHOCARDIOGRAM COMPLETE   Medication Changes: No orders of the defined types were placed in this encounter.

## 2022-04-15 ENCOUNTER — Inpatient Hospital Stay (HOSPITAL_COMMUNITY)
Admission: AD | Admit: 2022-04-15 | Discharge: 2022-04-17 | DRG: 807 | Disposition: A | Discharge status: Home / Self Care | Payer: 59 | Attending: Obstetrics & Gynecology | Admitting: Obstetrics & Gynecology

## 2022-04-15 ENCOUNTER — Encounter (HOSPITAL_COMMUNITY): Payer: Self-pay | Admitting: Obstetrics & Gynecology

## 2022-04-15 ENCOUNTER — Inpatient Hospital Stay (HOSPITAL_COMMUNITY): Payer: 59 | Admitting: Anesthesiology

## 2022-04-15 DIAGNOSIS — A6000 Herpesviral infection of urogenital system, unspecified: Secondary | ICD-10-CM | POA: Diagnosis present

## 2022-04-15 DIAGNOSIS — Z3A37 37 weeks gestation of pregnancy: Secondary | ICD-10-CM | POA: Diagnosis not present

## 2022-04-15 DIAGNOSIS — O364XX0 Maternal care for intrauterine death, not applicable or unspecified: Secondary | ICD-10-CM | POA: Diagnosis present

## 2022-04-15 DIAGNOSIS — O09523 Supervision of elderly multigravida, third trimester: Secondary | ICD-10-CM | POA: Diagnosis not present

## 2022-04-15 DIAGNOSIS — O0930 Supervision of pregnancy with insufficient antenatal care, unspecified trimester: Secondary | ICD-10-CM

## 2022-04-15 DIAGNOSIS — O9832 Other infections with a predominantly sexual mode of transmission complicating childbirth: Secondary | ICD-10-CM | POA: Diagnosis present

## 2022-04-15 DIAGNOSIS — O09293 Supervision of pregnancy with other poor reproductive or obstetric history, third trimester: Secondary | ICD-10-CM | POA: Diagnosis not present

## 2022-04-15 DIAGNOSIS — O09529 Supervision of elderly multigravida, unspecified trimester: Secondary | ICD-10-CM

## 2022-04-15 DIAGNOSIS — O26893 Other specified pregnancy related conditions, third trimester: Principal | ICD-10-CM | POA: Diagnosis present

## 2022-04-15 DIAGNOSIS — O99214 Obesity complicating childbirth: Secondary | ICD-10-CM | POA: Diagnosis present

## 2022-04-15 DIAGNOSIS — Z3490 Encounter for supervision of normal pregnancy, unspecified, unspecified trimester: Secondary | ICD-10-CM

## 2022-04-15 DIAGNOSIS — Z6791 Unspecified blood type, Rh negative: Secondary | ICD-10-CM | POA: Diagnosis not present

## 2022-04-15 DIAGNOSIS — Z789 Other specified health status: Secondary | ICD-10-CM | POA: Diagnosis present

## 2022-04-15 DIAGNOSIS — O26899 Other specified pregnancy related conditions, unspecified trimester: Secondary | ICD-10-CM

## 2022-04-15 DIAGNOSIS — Z641 Problems related to multiparity: Secondary | ICD-10-CM

## 2022-04-15 DIAGNOSIS — Z8619 Personal history of other infectious and parasitic diseases: Secondary | ICD-10-CM | POA: Diagnosis present

## 2022-04-15 LAB — TYPE AND SCREEN
ABO/RH(D): O NEG
Antibody Screen: NEGATIVE

## 2022-04-15 LAB — GROUP B STREP BY PCR: Group B strep by PCR: NEGATIVE

## 2022-04-15 LAB — CBC
HCT: 39.5 % (ref 36.0–46.0)
Hemoglobin: 12.7 g/dL (ref 12.0–15.0)
MCH: 27.6 pg (ref 26.0–34.0)
MCHC: 32.2 g/dL (ref 30.0–36.0)
MCV: 85.9 fL (ref 80.0–100.0)
Platelets: 288 10*3/uL (ref 150–400)
RBC: 4.6 MIL/uL (ref 3.87–5.11)
RDW: 15.2 % (ref 11.5–15.5)
WBC: 8.6 10*3/uL (ref 4.0–10.5)
nRBC: 0 % (ref 0.0–0.2)

## 2022-04-15 MED ORDER — OXYCODONE-ACETAMINOPHEN 5-325 MG PO TABS: 2.0000 | ORAL_TABLET | ORAL | Status: DC | PRN

## 2022-04-15 MED ORDER — LACTATED RINGERS IV SOLN: INTRAVENOUS | Status: DC

## 2022-04-15 MED ORDER — EPHEDRINE 5 MG/ML INJ: 10.0000 mg | INTRAVENOUS | Status: DC | PRN

## 2022-04-15 MED ORDER — FENTANYL-BUPIVACAINE-NACL 0.5-0.125-0.9 MG/250ML-% EP SOLN
12.0000 mL/h | EPIDURAL | Status: DC | PRN
  Administered 2022-04-15: 12 mL/h via EPIDURAL
  Filled 2022-04-15: qty 250

## 2022-04-15 MED ORDER — SOD CITRATE-CITRIC ACID 500-334 MG/5ML PO SOLN: 30.0000 mL | ORAL | Status: DC | PRN

## 2022-04-15 MED ORDER — LACTATED RINGERS IV SOLN
500.0000 mL | Freq: Once | INTRAVENOUS | Status: AC
  Administered 2022-04-15: 500 mL via INTRAVENOUS

## 2022-04-15 MED ORDER — ONDANSETRON HCL 4 MG/2ML IJ SOLN: 4.0000 mg | Freq: Four times a day (QID) | INTRAMUSCULAR | Status: DC | PRN

## 2022-04-15 MED ORDER — LACTATED RINGERS IV SOLN
500.0000 mL | INTRAVENOUS | Status: DC | PRN
  Administered 2022-04-16: 500 mL via INTRAVENOUS

## 2022-04-15 MED ORDER — LIDOCAINE HCL (PF) 1 % IJ SOLN: 30.0000 mL | INTRAMUSCULAR | Status: DC | PRN

## 2022-04-15 MED ORDER — LIDOCAINE HCL (PF) 1 % IJ SOLN
INTRAMUSCULAR | Status: DC | PRN
  Administered 2022-04-15: 11 mL via EPIDURAL

## 2022-04-15 MED ORDER — OXYTOCIN-SODIUM CHLORIDE 30-0.9 UT/500ML-% IV SOLN: 2.5000 [IU]/h | INTRAVENOUS | Status: DC

## 2022-04-15 MED ORDER — PHENYLEPHRINE 80 MCG/ML (10ML) SYRINGE FOR IV PUSH (FOR BLOOD PRESSURE SUPPORT): 80.0000 ug | PREFILLED_SYRINGE | INTRAVENOUS | Status: DC | PRN

## 2022-04-15 MED ORDER — OXYTOCIN BOLUS FROM INFUSION: 333.0000 mL | Freq: Once | INTRAVENOUS | Status: DC

## 2022-04-15 MED ORDER — PHENYLEPHRINE 80 MCG/ML (10ML) SYRINGE FOR IV PUSH (FOR BLOOD PRESSURE SUPPORT)
80.0000 ug | PREFILLED_SYRINGE | INTRAVENOUS | Status: DC | PRN
  Filled 2022-04-15: qty 10

## 2022-04-15 MED ORDER — DIPHENHYDRAMINE HCL 50 MG/ML IJ SOLN: 12.5000 mg | INTRAMUSCULAR | Status: DC | PRN

## 2022-04-15 MED ORDER — OXYCODONE-ACETAMINOPHEN 5-325 MG PO TABS: 1.0000 | ORAL_TABLET | ORAL | Status: DC | PRN

## 2022-04-15 MED ORDER — ACETAMINOPHEN 325 MG PO TABS: 650.0000 mg | ORAL_TABLET | ORAL | Status: DC | PRN

## 2022-04-15 NOTE — Anesthesia Procedure Notes (Signed)
Epidural Patient location during procedure: OB Start time: 04/15/2022 8:20 PM End time: 04/15/2022 8:38 PM  Staffing Anesthesiologist: Lowella Curb, MD Performed: anesthesiologist   Preanesthetic Checklist Completed: patient identified, IV checked, site marked, risks and benefits discussed, surgical consent, monitors and equipment checked, pre-op evaluation and timeout performed  Epidural Patient position: sitting Prep: ChloraPrep Patient monitoring: heart rate, cardiac monitor, continuous pulse ox and blood pressure Approach: midline Location: L2-L3 Injection technique: LOR air and LOR saline  Needle:  Needle type: Tuohy  Needle gauge: 17 G Needle length: 9 cm Needle insertion depth: 7 cm Catheter type: closed end flexible Catheter size: 20 Guage Catheter at skin depth: 11 cm Test dose: negative  Assessment Events: blood not aspirated, injection not painful, no injection resistance, no paresthesia and negative IV test  Additional Notes Reason for block:procedure for pain

## 2022-04-15 NOTE — MAU Note (Signed)
.  Wendy Juarez is a 36 y.o. at [redacted]w[redacted]d here in MAU reporting: ctx every 2-5 minutes. Denies VB or LOF. Reports good FM. Sharen Counter, CNM at bedside at time of patient arrival to room.   FHT:140

## 2022-04-15 NOTE — Anesthesia Preprocedure Evaluation (Signed)
Anesthesia Evaluation  Patient identified by MRN, date of birth, ID band Patient awake    Reviewed: Allergy & Precautions, NPO status , Patient's Chart, lab work & pertinent test results  Airway Mallampati: II  TM Distance: >3 FB Neck ROM: Full    Dental no notable dental hx.    Pulmonary neg pulmonary ROS,    Pulmonary exam normal breath sounds clear to auscultation       Cardiovascular negative cardio ROS Normal cardiovascular exam Rhythm:Regular Rate:Normal     Neuro/Psych Depression negative neurological ROS  negative psych ROS   GI/Hepatic negative GI ROS, Neg liver ROS,   Endo/Other  negative endocrine ROS  Renal/GU negative Renal ROS  negative genitourinary   Musculoskeletal negative musculoskeletal ROS (+)   Abdominal (+) + obese,   Peds negative pediatric ROS (+)  Hematology negative hematology ROS (+)   Anesthesia Other Findings   Reproductive/Obstetrics (+) Pregnancy                             Anesthesia Physical Anesthesia Plan  ASA: 2  Anesthesia Plan: Epidural   Post-op Pain Management:    Induction:   PONV Risk Score and Plan:   Airway Management Planned:   Additional Equipment:   Intra-op Plan:   Post-operative Plan:   Informed Consent:   Plan Discussed with:   Anesthesia Plan Comments:         Anesthesia Quick Evaluation

## 2022-04-15 NOTE — H&P (Addendum)
OBSTETRIC ADMISSION HISTORY AND PHYSICAL  Wendy Juarez is a 36 y.o. female [phone] with IUP at [redacted]w[redacted]d by 7w 1d U/S not equal to LMP presenting for SOL. She reports +FMs, No LOF, no VB, no blurry vision, headaches or peripheral edema, and RUQ pain. She was planning on having a home birth but she did not think that her midwife would make it in time. She wants the FOB to deliver the baby.  She plans on breast feeding. She does not desire birth control. 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); Rh neg (declines Rhogam); declines blood products; presyncope (had cards visit; echo appt pending)  Past Medical History: Past Medical History:  Diagnosis Date   Depression     Past Surgical History: Past Surgical History:  Procedure Laterality Date   CYSTECTOMY      Obstetrical History: OB History     Gravida  9   Para  8   Term  7   Preterm  1   AB  0   Living  7      SAB  0   IAB  0   Ectopic  0   Multiple  0   Live Births  7           Social History Social History   Socioeconomic History   Marital status: Married    Spouse name: Not on file   Number of children: Not on file   Years of education: Not on file   Highest education level: Not on file  Occupational History   Not on file  Tobacco Use   Smoking status: Never   Smokeless tobacco: Never  Vaping Use   Vaping Use: Never used  Substance and Sexual Activity   Alcohol use: Never   Drug use: Never   Sexual activity: Yes    Birth control/protection: None  Other Topics Concern   Not on file  Social History Narrative   Not on file   Social Determinants of Health   Financial Resource Strain: Not on file   Food Insecurity: Not on file  Transportation Needs: Not on file  Physical Activity: Not on file  Stress: Not on file  Social Connections: Not on file    Family History: Family History  Problem Relation Age of Onset   Breast cancer Maternal Grandmother     Allergies: No Known Allergies  Medications Prior to Admission  Medication Sig Dispense Refill Last Dose   cholecalciferol (VITAMIN D3) 25 MCG (1000 UNIT) tablet Take 1,000 Units by mouth daily.   04/15/2022   Cyanocobalamin (VITAMIN B12 PO) Take by mouth.   04/15/2022   magnesium 30 MG tablet Take 30 mg by mouth 2 (two) times daily.   04/15/2022   aspirin 81 MG chewable tablet Chew 1 tablet (81 mg total) by mouth daily. (Patient not taking: Reported on 03/20/2022) 90 tablet 3    ferrous sulfate 325 (65 FE) MG EC tablet Take 325 mg by mouth 3 (three) times daily with meals.      PRENATAL VIT-FE FUMARATE-FA PO Take by mouth.        Review of Systems   All systems reviewed and  negative except as stated in HPI  unknown if currently breastfeeding. General appearance: alert, cooperative, and appears stated age Lungs: clear to auscultation bilaterally Heart: regular rate and rhythm Abdomen: soft, non-tender; bowel sounds normal Pelvic: no abnormalities Extremities: Homans sign is negative, no sign of DVT DTR's intact Presentation: cephalic FHT: 150bpm with moderate variability. Unable to determine accelerations and decelerations at this time as patient is uncomfortable and awaiting epidural.  Uterine activity Frequency: Every 1-2 minutes  Dilation: 7 Effacement (%): 70 Exam by:: Sharen Counter, CNM   Prenatal labs: ABO, Rh:  O Negative Antibody: NO ANTIBODIES DETECTED (07/07 1100) Rubella:  Immune RPR: NON-REACTIVE (07/07 1100)  HBsAg:   Non Reactive HIV:   Non Reactive GBS:   Unknown 1 hr Glucola not performed Genetic screening: MaterniT21 Negative Anatomy US No abnormalities  Prenatal Transfer Tool   Maternal Diabetes: No, not screened, A1c 5.3 Genetic Screening: Normal Maternal Ultrasounds/Referrals: Normal Fetal Ultrasounds or other Referrals:  Referred to Materal Fetal Medicine  Maternal Substance Abuse:  No Significant Maternal Medications:  None Significant Maternal Lab Results: Other: GBS unknown  No results found for this or any previous visit (from the past 24 hour(s)).  Patient Active Problem List   Diagnosis Date Noted   Fetal abnormality in pregnancy 12/05/2021   No blood products 12/04/2021   Supervision of high risk pregnancy, antepartum 10/31/2021   Obesity affecting pregnancy 10/31/2021   AMA (advanced maternal age) multigravida 35+ 10/31/2021   Rh negative, antepartum 01/02/2021   IUFD at 20 weeks or more of gestation 03/17/2020   History of herpes genitalis 07/13/2019   Obesity (BMI 30-39.9) 07/11/2019    Assessment/Plan:  Wendy Juarez is a 36 y.o. [id number] at [redacted]w[redacted]d here for SOL.   #Labor:Active Labor, expectant management  #Pain: Awaiting epidural #FWB: Unable to assess due to patient discomfort.  #ID:  GBS unknown #MOF: Breast  #MOC: None #Circ:  Outpatient  #Rh negative: Declines Rhogam #HSV: On Valtrex  Colter L Cashion, MD  04/15/2022, 7:16 PM  CNM attestation:  I have seen and examined this patient; I agree with above documentation in the resident's note.   Wendy Juarez is a 36 y.o. [phone] here for spont labor  PE: There were no vitals taken for this visit. Gen: calm comfortable, NAD Resp: normal effort, no distress Abd: gravid  ROS, labs, PMH reviewed  Plan: Admit to L&D Manage expectantly Anticipate vag del  Arabella Merles CNM 04/15/2022, 8:27 PM

## 2022-04-15 NOTE — Progress Notes (Signed)
Patient ID: Wendy Juarez, female   DOB: Jul 12, 1986, 36 y.o.   MRN: [id number]  Comfortable now w epidural placed  BPs 116/50, 125/62 FHR 135, +accels, no decels Ctx were q around time of epidural placement, but have since spaced out Cx 7+/70/vtx -2; intact BOW  IUP@37 .3wks SOL GBS unk  -Plan expectant management for now -Discussed possibility of no cx change at next exam and recommendation of Pit or AROM -Anticipate vag del  Arabella Merles CNM 04/15/2022

## 2022-04-16 ENCOUNTER — Encounter (HOSPITAL_COMMUNITY): Payer: Self-pay | Admitting: Obstetrics & Gynecology

## 2022-04-16 DIAGNOSIS — O09293 Supervision of pregnancy with other poor reproductive or obstetric history, third trimester: Secondary | ICD-10-CM

## 2022-04-16 DIAGNOSIS — Z3A37 37 weeks gestation of pregnancy: Secondary | ICD-10-CM

## 2022-04-16 DIAGNOSIS — O09523 Supervision of elderly multigravida, third trimester: Secondary | ICD-10-CM

## 2022-04-16 LAB — RPR: RPR Ser Ql: NONREACTIVE

## 2022-04-16 MED ORDER — DIPHENHYDRAMINE HCL 25 MG PO CAPS: 25.0000 mg | ORAL_CAPSULE | Freq: Four times a day (QID) | ORAL | Status: DC | PRN

## 2022-04-16 MED ORDER — ACETAMINOPHEN 325 MG PO TABS: 650.0000 mg | ORAL_TABLET | ORAL | Status: DC | PRN

## 2022-04-16 MED ORDER — RHO D IMMUNE GLOBULIN 1500 UNIT/2ML IJ SOSY
300.0000 ug | PREFILLED_SYRINGE | Freq: Once | INTRAMUSCULAR | Status: DC
  Filled 2022-04-16: qty 2

## 2022-04-16 MED ORDER — MEASLES, MUMPS & RUBELLA VAC IJ SOLR: 0.5000 mL | Freq: Once | INTRAMUSCULAR | Status: DC

## 2022-04-16 MED ORDER — OXYCODONE HCL 5 MG PO TABS: 5.0000 mg | ORAL_TABLET | ORAL | Status: DC | PRN

## 2022-04-16 MED ORDER — TETANUS-DIPHTH-ACELL PERTUSSIS 5-2.5-18.5 LF-MCG/0.5 IM SUSY: 0.5000 mL | PREFILLED_SYRINGE | Freq: Once | INTRAMUSCULAR | Status: DC

## 2022-04-16 MED ORDER — DIBUCAINE (PERIANAL) 1 % EX OINT: 1.0000 | TOPICAL_OINTMENT | CUTANEOUS | Status: DC | PRN

## 2022-04-16 MED ORDER — ZOLPIDEM TARTRATE 5 MG PO TABS: 5.0000 mg | ORAL_TABLET | Freq: Every evening | ORAL | Status: DC | PRN

## 2022-04-16 MED ORDER — IBUPROFEN 600 MG PO TABS
600.0000 mg | ORAL_TABLET | Freq: Four times a day (QID) | ORAL | Status: DC
  Filled 2022-04-16: qty 1

## 2022-04-16 MED ORDER — SIMETHICONE 80 MG PO CHEW: 80.0000 mg | CHEWABLE_TABLET | ORAL | Status: DC | PRN

## 2022-04-16 MED ORDER — COCONUT OIL OIL: 1.0000 | TOPICAL_OIL | Status: DC | PRN

## 2022-04-16 MED ORDER — SENNOSIDES-DOCUSATE SODIUM 8.6-50 MG PO TABS
2.0000 | ORAL_TABLET | ORAL | Status: DC
  Filled 2022-04-16: qty 2

## 2022-04-16 MED ORDER — ONDANSETRON HCL 4 MG/2ML IJ SOLN: 4.0000 mg | INTRAMUSCULAR | Status: DC | PRN

## 2022-04-16 MED ORDER — ONDANSETRON HCL 4 MG PO TABS: 4.0000 mg | ORAL_TABLET | ORAL | Status: DC | PRN

## 2022-04-16 MED ORDER — BENZOCAINE-MENTHOL 20-0.5 % EX AERO
1.0000 | INHALATION_SPRAY | CUTANEOUS | Status: DC | PRN
  Administered 2022-04-16: 1 via TOPICAL
  Filled 2022-04-16: qty 56

## 2022-04-16 MED ORDER — WITCH HAZEL-GLYCERIN EX PADS: 1.0000 | MEDICATED_PAD | CUTANEOUS | Status: DC | PRN

## 2022-04-16 MED ORDER — PRENATAL MULTIVITAMIN CH: 1.0000 | ORAL_TABLET | Freq: Every day | ORAL | Status: DC

## 2022-04-16 NOTE — Progress Notes (Signed)
Patient ID: Wendy Juarez, female   DOB: September 13, 1985, 36 y.o.   MRN: [id number]  Comfortable w epidural  BPs 120/69, 111/48 FHR 130s, some 10x10 accels, occ mi variables Ctx difficult to trace Cx 8/80/vtx -2 at RN exam 0102  IUP@37 .4wks Protracted active labor, suspect due to sl inadequate ctx  As long as she is making some cx change, pt prefers to avoid Pit/AROM; is open to AROM possibly if no change at the next exam  Arabella Merles CNM 04/16/2022

## 2022-04-16 NOTE — Progress Notes (Signed)
Pt declines baby script postpartum education. Pt requests an education booklet. The Baby and Me booklet is given  to Ms Marschke.

## 2022-04-16 NOTE — Progress Notes (Signed)
Patient ID: TAFFIE ECKMANN, female   DOB: 12/24/1985, 36 y.o.   MRN: [id number]   Requesting AROM after discussion  VSS, afebrile FHR 130s, 10x10 accels Ctx q 3-4 mins Cx BBOW ruptured when touched for clear fluid/ 10/10/vtx -1  IUP@37 .4wks End 1st stage  Not feeling pressure to push yet Anticipate vag del  Arabella Merles [facility] 04/16/2022 3:29 AM

## 2022-04-16 NOTE — Lactation Note (Addendum)
This note was copied from a baby's chart. Lactation Consultation Note  Patient Name: Wendy Juarez [id number] Date: 04/16/2022   Age:36 hours As per RN, Thomos Lemons birth parent declined LC services    Maternal Data    Feeding    LATCH Score Latch: Grasps breast easily, tongue down, lips flanged, rhythmical sucking.  Audible Swallowing: A few with stimulation  Type of Nipple: Everted at rest and after stimulation  Comfort (Breast/Nipple): Soft / non-tender         Lactation Tools Discussed/Used    Interventions    Discharge    Consult Status      [name]  [name] 04/16/2022, 12:52 PM

## 2022-04-16 NOTE — Discharge Summary (Signed)
Postpartum Discharge Summary     Patient Name: Wendy Juarez DOB: [birth year] MRN: [id number]  Date of admission: 04/15/2022 Delivery date:04/16/2022  Delivering provider: Serita Grammes D  Date of discharge: 04/17/2022  Admitting diagnosis: Pregnancy [Z34.90] Intrauterine pregnancy: [redacted]w[redacted]d    Secondary diagnosis:  Principal Problem:   Pregnancy Active Problems:   History of herpes genitalis   IUFD at 232weeks or more of gestation   Rh negative, antepartum   AMA (advanced maternal age) multigravida 35+   No blood products   Grand multipara   Limited prenatal care  Additional problems: GBS unknown    Discharge diagnosis: Term Pregnancy Delivered                                              Post partum procedures: n/a Augmentation: AROM Complications: None  Hospital course: Onset of Labor With Vaginal Delivery      36y.o. yo [id number] 333w4das admitted in Active Labor on 04/15/2022. Patient had a protracted active labor course, trying to avoid intervention as much as possible. After SROM during exam, she became complete and labored down approx 2.5hrs prior to delivery. Membrane Rupture Time/Date: 3:20 AM ,04/16/2022   Delivery Method:Vaginal, Spontaneous  Episiotomy: None  Lacerations:  None  Patient had an uncomplicated postpartum course.  She is ambulating, tolerating a regular diet, passing flatus, and urinating well. Patient is discharged home in stable condition on 04/17/22.  Newborn Data: Birth date:04/16/2022  Birth time:5:55 AM  Gender:Female  Living status:Living  Apgars:9 ,10  Weight:2660 g   Magnesium Sulfate received: No BMZ received: No Rhophylac: declined MMR:N/A T-DaP: offered postpartum Flu: No Transfusion:No  Physical exam  Vitals:   04/16/22 1350 04/16/22 1758 04/16/22 2150 04/17/22 0526  BP: (!) 119/52 (!) 118/52 (!) 122/58 (!) 117/56  Pulse: 74 71 82 70  Resp: 16 16 17 18   Temp: 98.3 F (36.8 C) 98 F (36.7 C) 98.5 F (36.9 C) 98.3  F (36.8 C)  TempSrc: Axillary  Oral Oral  SpO2:   98% 98%   General: alert, cooperative, and no distress Lochia: appropriate Uterine Fundus: firm Incision: N/A DVT Evaluation: No evidence of DVT seen on physical exam. Labs: Lab Results  Component Value Date   WBC 8.6 04/15/2022   HGB 12.7 04/15/2022   HCT 39.5 04/15/2022   MCV 85.9 04/15/2022   PLT 288 04/15/2022      Latest Ref Rng & Units 12/28/2017    1:19 PM  CMP  Glucose 65 - 99 mg/dL 108   BUN 6 - 20 mg/dL 6   Creatinine 0.44 - 1.00 mg/dL 0.43   Sodium 135 - 145 mmol/L 134   Potassium 3.5 - 5.1 mmol/L 4.2   Chloride 101 - 111 mmol/L 106   CO2 22 - 32 mmol/L 18   Calcium 8.9 - 10.3 mg/dL 9.0   Total Protein 6.5 - 8.1 g/dL 7.7   Total Bilirubin 0.3 - 1.2 mg/dL 1.3   Alkaline Phos 38 - 126 U/L 85   AST 15 - 41 U/L 31   ALT 14 - 54 U/L 12    Edinburgh Score:    04/16/2022   11:50 AM  Edinburgh Postnatal Depression Scale Screening Tool  I have been able to laugh and see the funny side of things. 0  I have looked forward with  enjoyment to things. 0  I have blamed myself unnecessarily when things went wrong. 1  I have been anxious or worried for no good reason. 2  I have felt scared or panicky for no good reason. 0  Things have been getting on top of me. 1  I have been so unhappy that I have had difficulty sleeping. 1  I have felt sad or miserable. 1  I have been so unhappy that I have been crying. 0  The thought of harming myself has occurred to me. 0  Edinburgh Postnatal Depression Scale Total 6     After visit meds:  Allergies as of 04/17/2022   No Known Allergies      Medication List     STOP taking these medications    aspirin 81 MG chewable tablet       TAKE these medications    ferrous sulfate 325 (65 FE) MG EC tablet Take 325 mg by mouth daily.   MAGNESIUM PO Take 1 tablet by mouth daily.   PRENATAL PO Take 1 tablet by mouth daily.   VITAMIN B12 PO Take 1 tablet by mouth daily.    VITAMIN D-3 PO Take 1 capsule by mouth daily.         Discharge home in stable condition Infant Feeding: Breast Infant Disposition:home with mother Discharge instruction: per After Visit Summary and Postpartum booklet. Activity: Advance as tolerated. Pelvic rest for 6 weeks.  Diet: routine diet Future Appointments: Future Appointments  Date Time Provider [place]  04/22/2022  1:05 PM [facility] ECHO 5 [id number]  07/03/2022  8:20 AM Tobb, Godfrey Pick, DO [facility]   Follow up Visit:  Myrtis Ser, CNM  P [facility] Support Pool Please schedule this patient for Postpartum visit in: 6 weeks with the following provider: Any provider  In-Person  For C/S patients schedule nurse incision check in weeks 2 weeks: no  Low risk pregnancy complicated by: grandmultip, limited care  Delivery mode:  SVD  Anticipated Birth Control:  other/unsure  PP Procedures needed: none  Schedule Integrated BH visit: no   04/17/2022 Pierrette Scheu Aut[place]-Lott, DO

## 2022-04-17 LAB — RH IG WORKUP (INCLUDES ABO/RH)
Fetal Screen: NEGATIVE
Gestational Age(Wks): 37
Unit division: 0

## 2022-04-17 NOTE — Progress Notes (Signed)
CSW received consult for hx of Anxiety and Depression.  CSW met with MOB to offer support and complete assessment.    When CSW arrived, MOB was resting in bed, infant was asleep in bassinet, and FOB was resting on the couch.  CSW explained CSW role and the need to complete an assessment. MOB gave CSW permission to complete the assessment while FOB was present.  FOB appeared to be a support to MOB and engaged with CSW. MOB was polite, easy to engage, and receptive to meeting with CSW. However, MOB declined information about PMADs and communicated, "I don't have a dx of depression; I had grief and loss after losing my baby in 2021. CSW was understanding however noticed that MOB was tearful while speaking with CSW. CSW asked about MOB's tears and MOB and FOB openly shared their frustration about wanting to discharge however, infant has not being medically cleared. CSW validated and normalized the couples thoughts and feelings and suggested that they speak with infant's provider to solidify goals for infant's discharge; they couple agreed. Per the couple they have all essential items to care for infant post discharge and they have a good support team. MOB shared feeling comfortable seeking profession help if need for PMADs.   There are no barriers to discharge.   Angel Boyd-Gilyard, MSW, LCSW Clinical Social Work [phone] 

## 2022-04-17 NOTE — Anesthesia Postprocedure Evaluation (Signed)
Anesthesia Post Note  Patient: Wendy Juarez  Procedure(s) Performed: AN AD HOC LABOR EPIDURAL     Patient location during evaluation: Mother Baby Anesthesia Type: Epidural Level of consciousness: awake and alert Pain management: pain level controlled Vital Signs Assessment: post-procedure vital signs reviewed and stable Respiratory status: spontaneous breathing, nonlabored ventilation and respiratory function stable Cardiovascular status: stable Postop Assessment: no headache, no backache, epidural receding, no apparent nausea or vomiting, patient able to bend at knees, adequate PO intake and able to ambulate Anesthetic complications: no   No notable events documented.  Last Vitals:  Vitals:   04/16/22 2150 04/17/22 0526  BP: (!) 122/58 (!) 117/56  Pulse: 82 70  Resp: 17 18  Temp: 36.9 C 36.8 C  SpO2: 98% 98%    Last Pain:  Vitals:   04/17/22 0815  TempSrc:   PainSc: 0-No pain   Pain Goal:                Epidural/Spinal Function Cutaneous sensation: Normal sensation (04/17/22 0815)  Laban Emperor

## 2022-04-22 ENCOUNTER — Ambulatory Visit (HOSPITAL_COMMUNITY): Payer: 59 | Attending: Cardiology

## 2022-04-22 DIAGNOSIS — R42 Dizziness and giddiness: Secondary | ICD-10-CM | POA: Insufficient documentation

## 2022-04-22 DIAGNOSIS — R55 Syncope and collapse: Secondary | ICD-10-CM | POA: Insufficient documentation

## 2022-04-22 LAB — ECHOCARDIOGRAM COMPLETE
Area-P 1/2: 3.83 cm2
S' Lateral: 2.7 cm

## 2022-04-24 ENCOUNTER — Telehealth (HOSPITAL_COMMUNITY): Payer: Self-pay | Admitting: *Deleted

## 2022-04-24 ENCOUNTER — Ambulatory Visit: Payer: 59

## 2022-04-24 NOTE — Telephone Encounter (Signed)
Mom reports feeling good. No concerns about herself at this time. EPDS=6 [facility] score=6) Mom reports baby is doing well. Feeding, peeing, and pooping without difficulty. Safe sleep reviewed. Mom reports no concerns about baby at present.  Duffy Rhody, RN 04-24-2022 at 11:25am

## 2022-07-03 ENCOUNTER — Ambulatory Visit: Payer: 59 | Attending: Cardiology | Admitting: Cardiology

## 2022-07-09 ENCOUNTER — Encounter: Payer: Self-pay | Admitting: Cardiology
